# Patient Record
Sex: Female | Born: 1935 | Race: White | Hispanic: No | Smoking: Former smoker
Health system: Southern US, Community
[De-identification: ages and names within clinical notes are randomized; demographics above are authoritative.]

## PROBLEM LIST (undated history)

## (undated) DIAGNOSIS — K5909 Other constipation: Secondary | ICD-10-CM

## (undated) DIAGNOSIS — F32A Depression, unspecified: Secondary | ICD-10-CM

## (undated) DIAGNOSIS — K56609 Unspecified intestinal obstruction, unspecified as to partial versus complete obstruction: Secondary | ICD-10-CM

## (undated) DIAGNOSIS — J449 Chronic obstructive pulmonary disease, unspecified: Secondary | ICD-10-CM

## (undated) DIAGNOSIS — H25019 Cortical age-related cataract, unspecified eye: Secondary | ICD-10-CM

## (undated) DIAGNOSIS — M810 Age-related osteoporosis without current pathological fracture: Secondary | ICD-10-CM

## (undated) DIAGNOSIS — K469 Unspecified abdominal hernia without obstruction or gangrene: Secondary | ICD-10-CM

## (undated) DIAGNOSIS — J45909 Unspecified asthma, uncomplicated: Secondary | ICD-10-CM

## (undated) DIAGNOSIS — F329 Major depressive disorder, single episode, unspecified: Secondary | ICD-10-CM

## (undated) DIAGNOSIS — K859 Acute pancreatitis without necrosis or infection, unspecified: Secondary | ICD-10-CM

## (undated) DIAGNOSIS — K219 Gastro-esophageal reflux disease without esophagitis: Secondary | ICD-10-CM

## (undated) DIAGNOSIS — K8020 Calculus of gallbladder without cholecystitis without obstruction: Secondary | ICD-10-CM

## (undated) DIAGNOSIS — I10 Essential (primary) hypertension: Secondary | ICD-10-CM

## (undated) DIAGNOSIS — M1990 Unspecified osteoarthritis, unspecified site: Secondary | ICD-10-CM

## (undated) DIAGNOSIS — K635 Polyp of colon: Secondary | ICD-10-CM

## (undated) HISTORY — PX: HERNIA REPAIR: SHX51

## (undated) HISTORY — PX: HIATAL HERNIA REPAIR: SHX195

## (undated) HISTORY — PX: COLONOSCOPY: SHX174

## (undated) HISTORY — PX: CHOLECYSTECTOMY: SHX55

## (undated) HISTORY — PX: POLYPECTOMY: SHX149

## (undated) HISTORY — PX: CYST EXCISION: SHX5701

## (undated) HISTORY — PX: CATARACT EXTRACTION, BILATERAL: SHX1313

## (undated) HISTORY — PX: EYE SURGERY: SHX253

## (undated) HISTORY — PX: TONSILLECTOMY: SUR1361

## (undated) HISTORY — PX: STAPEDECTOMY: SHX2435

## (undated) HISTORY — PX: ABDOMINAL HYSTERECTOMY: SHX81

## (undated) HISTORY — PX: SEPTOPLASTY: SUR1290

---

## 2011-06-17 ENCOUNTER — Inpatient Hospital Stay: Payer: Self-pay | Admitting: Radiology

## 2011-06-20 DIAGNOSIS — I369 Nonrheumatic tricuspid valve disorder, unspecified: Secondary | ICD-10-CM

## 2011-06-28 DIAGNOSIS — K219 Gastro-esophageal reflux disease without esophagitis: Secondary | ICD-10-CM

## 2011-06-28 DIAGNOSIS — K469 Unspecified abdominal hernia without obstruction or gangrene: Secondary | ICD-10-CM

## 2011-06-28 DIAGNOSIS — I4891 Unspecified atrial fibrillation: Secondary | ICD-10-CM

## 2011-06-28 DIAGNOSIS — R609 Edema, unspecified: Secondary | ICD-10-CM

## 2011-07-12 DIAGNOSIS — K436 Other and unspecified ventral hernia with obstruction, without gangrene: Secondary | ICD-10-CM

## 2011-07-12 DIAGNOSIS — I4891 Unspecified atrial fibrillation: Secondary | ICD-10-CM

## 2011-07-12 DIAGNOSIS — R609 Edema, unspecified: Secondary | ICD-10-CM

## 2011-07-12 DIAGNOSIS — K219 Gastro-esophageal reflux disease without esophagitis: Secondary | ICD-10-CM

## 2011-09-05 ENCOUNTER — Ambulatory Visit: Payer: Medicare Other | Admitting: Internal Medicine

## 2011-09-11 DIAGNOSIS — K859 Acute pancreatitis without necrosis or infection, unspecified: Secondary | ICD-10-CM

## 2011-09-11 HISTORY — DX: Acute pancreatitis without necrosis or infection, unspecified: K85.90

## 2012-02-10 ENCOUNTER — Inpatient Hospital Stay: Payer: Self-pay | Admitting: Internal Medicine

## 2012-02-10 LAB — LIPASE, BLOOD: Lipase: 2356 U/L — ABNORMAL HIGH (ref 73–393)

## 2012-02-10 LAB — COMPREHENSIVE METABOLIC PANEL
Albumin: 3.4 g/dL (ref 3.4–5.0)
Alkaline Phosphatase: 136 U/L (ref 50–136)
Anion Gap: 9 (ref 7–16)
Calcium, Total: 8.8 mg/dL (ref 8.5–10.1)
Chloride: 105 mmol/L (ref 98–107)
Co2: 29 mmol/L (ref 21–32)
Creatinine: 0.96 mg/dL (ref 0.60–1.30)
EGFR (African American): >60
Glucose: 123 mg/dL — ABNORMAL HIGH (ref 65–99)
Potassium: 3.9 mmol/L (ref 3.5–5.1)
SGPT (ALT): 22 U/L
Sodium: 143 mmol/L (ref 136–145)
Total Protein: 7.6 g/dL (ref 6.4–8.2)

## 2012-02-10 LAB — URINALYSIS, COMPLETE
Blood: NEGATIVE
Glucose,UR: NEGATIVE mg/dL (ref 0–75)
Hyaline Cast: 1
Leukocyte Esterase: NEGATIVE
Ph: 5 (ref 4.5–8.0)
Protein: NEGATIVE
RBC,UR: <1 /HPF (ref 0–5)
Specific Gravity: 1.009 (ref 1.003–1.030)
Squamous Epithelial: 2
WBC UR: <1 /HPF (ref 0–5)

## 2012-02-10 LAB — CBC
HCT: 48.6 % — ABNORMAL HIGH (ref 35.0–47.0)
HGB: 15.8 g/dL (ref 12.0–16.0)
MCH: 30.2 pg (ref 26.0–34.0)
MCHC: 32.5 g/dL (ref 32.0–36.0)
MCV: 93 fL (ref 80–100)
RBC: 5.23 10*6/uL — ABNORMAL HIGH (ref 3.80–5.20)
RDW: 13 % (ref 11.5–14.5)
WBC: 11.3 10*3/uL — ABNORMAL HIGH (ref 3.6–11.0)

## 2012-02-10 LAB — TROPONIN I: Troponin-I: <0.02 ng/mL

## 2012-02-11 LAB — COMPREHENSIVE METABOLIC PANEL
Albumin: 2.8 g/dL — ABNORMAL LOW (ref 3.4–5.0)
Anion Gap: 6 — ABNORMAL LOW (ref 7–16)
Calcium, Total: 7.6 mg/dL — ABNORMAL LOW (ref 8.5–10.1)
Creatinine: 0.77 mg/dL (ref 0.60–1.30)
EGFR (African American): >60
EGFR (Non-African Amer.): >60
Osmolality: 277 (ref 275–301)
Potassium: 4.1 mmol/L (ref 3.5–5.1)
SGOT(AST): 98 U/L — ABNORMAL HIGH (ref 15–37)
Sodium: 140 mmol/L (ref 136–145)
Total Protein: 6.4 g/dL (ref 6.4–8.2)

## 2012-02-11 LAB — LIPID PANEL
HDL Cholesterol: 60 mg/dL (ref 40–60)
Triglycerides: 79 mg/dL (ref 0–200)
VLDL Cholesterol, Calc: 16 mg/dL (ref 5–40)

## 2012-02-11 LAB — CBC WITH DIFFERENTIAL/PLATELET
Basophil #: 0.1 10*3/uL (ref 0.0–0.1)
Basophil %: 0.8 %
Eosinophil #: 0.3 10*3/uL (ref 0.0–0.7)
HCT: 42.1 % (ref 35.0–47.0)
MCHC: 32.1 g/dL (ref 32.0–36.0)
MCV: 93 fL (ref 80–100)
Monocyte %: 9.1 %
Neutrophil #: 6.5 10*3/uL (ref 1.4–6.5)
Neutrophil %: 64.3 %
Platelet: 224 10*3/uL (ref 150–440)
RBC: 4.51 10*6/uL (ref 3.80–5.20)
RDW: 13 % (ref 11.5–14.5)

## 2012-02-11 LAB — HEMOGLOBIN A1C: Hemoglobin A1C: 5.6 % (ref 4.2–6.3)

## 2012-02-11 LAB — IRON AND TIBC
Iron Bind.Cap.(Total): 259 ug/dL (ref 250–450)
Iron Saturation: 32 %
Iron: 84 ug/dL (ref 50–170)
Unbound Iron-Bind.Cap.: 175 ug/dL

## 2012-02-11 LAB — LIPASE, BLOOD: Lipase: 170 U/L (ref 73–393)

## 2012-02-11 LAB — RETICULOCYTES: Reticulocyte: 1.1 % (ref 0.5–1.5)

## 2012-02-11 LAB — TSH: Thyroid Stimulating Horm: 0.984 u[IU]/mL

## 2012-02-12 LAB — BASIC METABOLIC PANEL
Anion Gap: 10 (ref 7–16)
BUN: 7 mg/dL (ref 7–18)
Calcium, Total: 8.6 mg/dL (ref 8.5–10.1)
Chloride: 106 mmol/L (ref 98–107)
Creatinine: 0.79 mg/dL (ref 0.60–1.30)
EGFR (African American): >60
Glucose: 94 mg/dL (ref 65–99)
Potassium: 3.7 mmol/L (ref 3.5–5.1)
Sodium: 143 mmol/L (ref 136–145)

## 2012-02-12 LAB — CBC WITH DIFFERENTIAL/PLATELET
Basophil #: 0.1 10*3/uL (ref 0.0–0.1)
Basophil %: 0.8 %
Eosinophil #: 0.5 10*3/uL (ref 0.0–0.7)
HGB: 14.1 g/dL (ref 12.0–16.0)
MCHC: 32.6 g/dL (ref 32.0–36.0)
MCV: 94 fL (ref 80–100)
Monocyte #: 0.7 x10 3/mm (ref 0.2–0.9)
Neutrophil #: 5.3 10*3/uL (ref 1.4–6.5)
Neutrophil %: 60.2 %
Platelet: 228 10*3/uL (ref 150–440)
RDW: 13.4 % (ref 11.5–14.5)

## 2012-02-12 LAB — HEPATIC FUNCTION PANEL A (ARMC)
Albumin: 3.1 g/dL — ABNORMAL LOW (ref 3.4–5.0)
Alkaline Phosphatase: 153 U/L — ABNORMAL HIGH (ref 50–136)
Bilirubin, Direct: <0.1 mg/dL (ref 0.00–0.20)
Bilirubin,Total: 0.5 mg/dL (ref 0.2–1.0)
Total Protein: 7 g/dL (ref 6.4–8.2)

## 2012-03-28 ENCOUNTER — Inpatient Hospital Stay: Payer: Self-pay | Admitting: Surgery

## 2012-03-28 LAB — COMPREHENSIVE METABOLIC PANEL
Albumin: 3.5 g/dL (ref 3.4–5.0)
Anion Gap: 5 — ABNORMAL LOW (ref 7–16)
BUN: 11 mg/dL (ref 7–18)
Calcium, Total: 9.1 mg/dL (ref 8.5–10.1)
EGFR (African American): 58 — ABNORMAL LOW
EGFR (Non-African Amer.): 50 — ABNORMAL LOW
Glucose: 110 mg/dL — ABNORMAL HIGH (ref 65–99)
Osmolality: 279 (ref 275–301)
Potassium: 4.3 mmol/L (ref 3.5–5.1)
SGOT(AST): 37 U/L (ref 15–37)
Sodium: 140 mmol/L (ref 136–145)
Total Protein: 7.9 g/dL (ref 6.4–8.2)

## 2012-03-28 LAB — URINALYSIS, COMPLETE
Bilirubin,UR: NEGATIVE
Nitrite: NEGATIVE
Ph: 5 (ref 4.5–8.0)
Protein: NEGATIVE
RBC,UR: 1 /HPF (ref 0–5)

## 2012-03-28 LAB — CBC
HGB: 15.7 g/dL (ref 12.0–16.0)
MCH: 29.6 pg (ref 26.0–34.0)
MCHC: 31.8 g/dL — ABNORMAL LOW (ref 32.0–36.0)
MCV: 93 fL (ref 80–100)
Platelet: 269 10*3/uL (ref 150–440)
RDW: 12.9 % (ref 11.5–14.5)
WBC: 11.8 10*3/uL — ABNORMAL HIGH (ref 3.6–11.0)

## 2012-03-28 LAB — LIPASE, BLOOD: Lipase: 129 U/L (ref 73–393)

## 2012-03-29 LAB — CBC WITH DIFFERENTIAL/PLATELET
Basophil %: 0.7 %
Eosinophil #: 0.4 10*3/uL (ref 0.0–0.7)
Eosinophil %: 6.1 %
HGB: 13.4 g/dL (ref 12.0–16.0)
Lymphocyte #: 2.2 10*3/uL (ref 1.0–3.6)
MCH: 30.1 pg (ref 26.0–34.0)
MCV: 94 fL (ref 80–100)
Monocyte #: 0.5 x10 3/mm (ref 0.2–0.9)
Monocyte %: 7.8 %
Neutrophil #: 3.5 10*3/uL (ref 1.4–6.5)
Neutrophil %: 51.9 %
Platelet: 226 10*3/uL (ref 150–440)
RBC: 4.46 10*6/uL (ref 3.80–5.20)

## 2012-03-30 LAB — BASIC METABOLIC PANEL
BUN: 4 mg/dL — ABNORMAL LOW (ref 7–18)
Chloride: 106 mmol/L (ref 98–107)
Co2: 27 mmol/L (ref 21–32)
EGFR (Non-African Amer.): >60
Glucose: 99 mg/dL (ref 65–99)
Osmolality: 280 (ref 275–301)
Potassium: 4.1 mmol/L (ref 3.5–5.1)
Sodium: 142 mmol/L (ref 136–145)

## 2012-04-29 ENCOUNTER — Observation Stay: Payer: Self-pay | Admitting: Surgery

## 2012-04-29 LAB — CBC
HGB: 15.4 g/dL (ref 12.0–16.0)
MCH: 31 pg (ref 26.0–34.0)
Platelet: 273 10*3/uL (ref 150–440)
RBC: 4.99 10*6/uL (ref 3.80–5.20)
WBC: 6.9 10*3/uL (ref 3.6–11.0)

## 2012-04-29 LAB — COMPREHENSIVE METABOLIC PANEL
Albumin: 3.2 g/dL — ABNORMAL LOW (ref 3.4–5.0)
Alkaline Phosphatase: 142 U/L — ABNORMAL HIGH (ref 50–136)
Anion Gap: 5 — ABNORMAL LOW (ref 7–16)
Calcium, Total: 8.8 mg/dL (ref 8.5–10.1)
EGFR (African American): >60
Glucose: 92 mg/dL (ref 65–99)
Osmolality: 283 (ref 275–301)
SGOT(AST): 32 U/L (ref 15–37)
SGPT (ALT): 19 U/L (ref 12–78)
Sodium: 142 mmol/L (ref 136–145)
Total Protein: 7.4 g/dL (ref 6.4–8.2)

## 2012-04-29 LAB — URINALYSIS, COMPLETE
Bilirubin,UR: NEGATIVE
Blood: NEGATIVE
Ph: 5 (ref 4.5–8.0)
RBC,UR: <1 /HPF (ref 0–5)
Squamous Epithelial: <1
WBC UR: 1 /HPF (ref 0–5)

## 2012-04-29 LAB — DIFFERENTIAL
Basophil #: 0.1 10*3/uL (ref 0.0–0.1)
Eosinophil #: 0.3 10*3/uL (ref 0.0–0.7)
Lymphocyte #: 2.3 10*3/uL (ref 1.0–3.6)
Monocyte %: 7.4 %
Neutrophil #: 3.7 10*3/uL (ref 1.4–6.5)
Neutrophil %: 54 %

## 2012-04-30 LAB — BASIC METABOLIC PANEL
Anion Gap: 7 (ref 7–16)
BUN: 13 mg/dL (ref 7–18)
Calcium, Total: 8.6 mg/dL (ref 8.5–10.1)
Co2: 29 mmol/L (ref 21–32)
Creatinine: 0.87 mg/dL (ref 0.60–1.30)
EGFR (African American): >60
EGFR (Non-African Amer.): 58 — ABNORMAL LOW
Glucose: 103 mg/dL — ABNORMAL HIGH (ref 65–99)
Osmolality: 280 (ref 275–301)
Sodium: 140 mmol/L (ref 136–145)

## 2012-04-30 LAB — CBC WITH DIFFERENTIAL/PLATELET
Basophil #: 0 10*3/uL (ref 0.0–0.1)
Eosinophil #: 0.5 10*3/uL (ref 0.0–0.7)
HCT: 44.3 % (ref 35.0–47.0)
HGB: 14.6 g/dL (ref 12.0–16.0)
Lymphocyte #: 2.5 10*3/uL (ref 1.0–3.6)
MCHC: 33 g/dL (ref 32.0–36.0)
MCV: 93 fL (ref 80–100)
Monocyte #: 0.8 x10 3/mm (ref 0.2–0.9)
Monocyte %: 9.6 %
Neutrophil #: 4.5 10*3/uL (ref 1.4–6.5)
Neutrophil %: 54.2 %
Platelet: 270 10*3/uL (ref 150–440)
RDW: 13.2 % (ref 11.5–14.5)
WBC: 8.4 10*3/uL (ref 3.6–11.0)

## 2012-04-30 LAB — LIPASE, BLOOD: Lipase: 122 U/L (ref 73–393)

## 2012-05-01 HISTORY — PX: ESOPHAGOGASTRODUODENOSCOPY: SHX1529

## 2012-05-19 ENCOUNTER — Ambulatory Visit: Payer: Self-pay | Admitting: Surgery

## 2012-05-19 LAB — CBC WITH DIFFERENTIAL/PLATELET
Basophil #: 0.1 10*3/uL (ref 0.0–0.1)
Eosinophil #: 0.5 10*3/uL (ref 0.0–0.7)
Eosinophil %: 5.7 %
HCT: 43.3 % (ref 35.0–47.0)
HGB: 14.3 g/dL (ref 12.0–16.0)
Lymphocyte #: 2.7 10*3/uL (ref 1.0–3.6)
Lymphocyte %: 32 %
MCV: 93 fL (ref 80–100)
Monocyte #: 0.6 x10 3/mm (ref 0.2–0.9)
Monocyte %: 7.7 %
Neutrophil #: 4.5 10*3/uL (ref 1.4–6.5)
RBC: 4.64 10*6/uL (ref 3.80–5.20)
RDW: 13.4 % (ref 11.5–14.5)
WBC: 8.4 10*3/uL (ref 3.6–11.0)

## 2012-05-19 LAB — BASIC METABOLIC PANEL
Anion Gap: 2 — ABNORMAL LOW (ref 7–16)
Calcium, Total: 9.1 mg/dL (ref 8.5–10.1)
Creatinine: 0.77 mg/dL (ref 0.60–1.30)
EGFR (African American): >60
EGFR (Non-African Amer.): >60
Glucose: 77 mg/dL (ref 65–99)
Osmolality: 281 (ref 275–301)
Potassium: 3.7 mmol/L (ref 3.5–5.1)

## 2012-05-26 ENCOUNTER — Inpatient Hospital Stay: Payer: Self-pay | Admitting: Surgery

## 2012-05-27 LAB — BASIC METABOLIC PANEL
Anion Gap: 1 — ABNORMAL LOW (ref 7–16)
BUN: 10 mg/dL (ref 7–18)
Calcium, Total: 8.2 mg/dL — ABNORMAL LOW (ref 8.5–10.1)
Calcium, Total: 8.3 mg/dL — ABNORMAL LOW (ref 8.5–10.1)
Chloride: 107 mmol/L (ref 98–107)
Co2: 28 mmol/L (ref 21–32)
Creatinine: 0.82 mg/dL (ref 0.60–1.30)
EGFR (African American): >60
EGFR (African American): >60
EGFR (Non-African Amer.): 52 — ABNORMAL LOW
Osmolality: 280 (ref 275–301)
Potassium: 4.7 mmol/L (ref 3.5–5.1)
Sodium: 139 mmol/L (ref 136–145)

## 2012-05-27 LAB — CBC WITH DIFFERENTIAL/PLATELET
Basophil #: 0.1 10*3/uL (ref 0.0–0.1)
Basophil %: 0.7 %
Eosinophil #: 0 10*3/uL (ref 0.0–0.7)
Eosinophil %: 0 %
HCT: 41 % (ref 35.0–47.0)
HGB: 13.3 g/dL (ref 12.0–16.0)
Lymphocyte %: 6.6 %
MCH: 30.1 pg (ref 26.0–34.0)
MCHC: 32.4 g/dL (ref 32.0–36.0)
Monocyte #: 1.2 x10 3/mm — ABNORMAL HIGH (ref 0.2–0.9)

## 2012-05-28 LAB — CBC WITH DIFFERENTIAL/PLATELET
Basophil #: 0.1 10*3/uL (ref 0.0–0.1)
Basophil %: 0.6 %
Eosinophil #: 0 10*3/uL (ref 0.0–0.7)
HCT: 40.2 % (ref 35.0–47.0)
Lymphocyte #: 1.4 10*3/uL (ref 1.0–3.6)
Lymphocyte %: 12 %
MCHC: 33 g/dL (ref 32.0–36.0)
Neutrophil #: 9.2 10*3/uL — ABNORMAL HIGH (ref 1.4–6.5)
Platelet: 190 10*3/uL (ref 150–440)
RBC: 4.28 10*6/uL (ref 3.80–5.20)
RDW: 13.6 % (ref 11.5–14.5)
WBC: 11.8 10*3/uL — ABNORMAL HIGH (ref 3.6–11.0)

## 2012-05-28 LAB — BASIC METABOLIC PANEL
Anion Gap: 5 — ABNORMAL LOW (ref 7–16)
BUN: 8 mg/dL (ref 7–18)
Calcium, Total: 7.8 mg/dL — ABNORMAL LOW (ref 8.5–10.1)
Chloride: 108 mmol/L — ABNORMAL HIGH (ref 98–107)
Creatinine: 0.77 mg/dL (ref 0.60–1.30)
Glucose: 123 mg/dL — ABNORMAL HIGH (ref 65–99)
Osmolality: 279 (ref 275–301)
Potassium: 4.3 mmol/L (ref 3.5–5.1)
Sodium: 140 mmol/L (ref 136–145)

## 2012-05-29 LAB — CBC WITH DIFFERENTIAL/PLATELET
Basophil %: 0.7 %
Eosinophil #: 0.2 10*3/uL (ref 0.0–0.7)
Eosinophil %: 1.6 %
HCT: 43.6 % (ref 35.0–47.0)
HGB: 14.2 g/dL (ref 12.0–16.0)
Lymphocyte #: 2.4 10*3/uL (ref 1.0–3.6)
Lymphocyte %: 19.3 %
MCH: 30.8 pg (ref 26.0–34.0)
MCHC: 32.6 g/dL (ref 32.0–36.0)
Monocyte #: 1 x10 3/mm — ABNORMAL HIGH (ref 0.2–0.9)
Monocyte %: 8.2 %
Neutrophil %: 70.2 %
Platelet: 238 10*3/uL (ref 150–440)
RBC: 4.6 10*6/uL (ref 3.80–5.20)
WBC: 12.5 10*3/uL — ABNORMAL HIGH (ref 3.6–11.0)

## 2012-06-01 LAB — CREATININE, SERUM
Creatinine: 0.64 mg/dL (ref 0.60–1.30)
EGFR (African American): >60
EGFR (Non-African Amer.): >60

## 2012-06-02 LAB — BASIC METABOLIC PANEL
Anion Gap: 7 (ref 7–16)
Chloride: 100 mmol/L (ref 98–107)
Co2: 36 mmol/L — ABNORMAL HIGH (ref 21–32)
Osmolality: 282 (ref 275–301)
Potassium: 2.8 mmol/L — ABNORMAL LOW (ref 3.5–5.1)

## 2012-06-04 LAB — BASIC METABOLIC PANEL
BUN: 10 mg/dL (ref 7–18)
Calcium, Total: 8.6 mg/dL (ref 8.5–10.1)
Chloride: 100 mmol/L (ref 98–107)
Co2: 37 mmol/L — ABNORMAL HIGH (ref 21–32)
Creatinine: 0.72 mg/dL (ref 0.60–1.30)
Glucose: 95 mg/dL (ref 65–99)
Osmolality: 280 (ref 275–301)
Potassium: 4 mmol/L (ref 3.5–5.1)
Sodium: 141 mmol/L (ref 136–145)

## 2013-10-01 LAB — COMPREHENSIVE METABOLIC PANEL
Albumin: 3.2 g/dL — ABNORMAL LOW (ref 3.4–5.0)
Alkaline Phosphatase: 98 U/L
Anion Gap: 2 — ABNORMAL LOW (ref 7–16)
BUN: 13 mg/dL (ref 7–18)
Bilirubin,Total: 0.5 mg/dL (ref 0.2–1.0)
Calcium, Total: 8.4 mg/dL — ABNORMAL LOW (ref 8.5–10.1)
Chloride: 104 mmol/L (ref 98–107)
Co2: 30 mmol/L (ref 21–32)
Creatinine: 0.92 mg/dL (ref 0.60–1.30)
EGFR (African American): >60
EGFR (Non-African Amer.): 54 — ABNORMAL LOW
GLUCOSE: 103 mg/dL — AB (ref 65–99)
OSMOLALITY: 272 (ref 275–301)
Potassium: 4.2 mmol/L (ref 3.5–5.1)
SGOT(AST): 46 U/L — ABNORMAL HIGH (ref 15–37)
SGPT (ALT): 26 U/L (ref 12–78)
Sodium: 136 mmol/L (ref 136–145)
Total Protein: 7 g/dL (ref 6.4–8.2)

## 2013-10-01 LAB — CK TOTAL AND CKMB (NOT AT ARMC)
CK, TOTAL: 49 U/L (ref 21–215)
CK-MB: 0.8 ng/mL (ref 0.5–3.6)

## 2013-10-01 LAB — CK-MB
CK-MB: 0.9 ng/mL (ref 0.5–3.6)
CK-MB: 1.1 ng/mL (ref 0.5–3.6)

## 2013-10-01 LAB — TROPONIN I
Troponin-I: <0.02 ng/mL
Troponin-I: <0.02 ng/mL

## 2013-10-01 LAB — CBC
HCT: 42.7 % (ref 35.0–47.0)
HGB: 14 g/dL (ref 12.0–16.0)
MCH: 30.5 pg (ref 26.0–34.0)
MCHC: 32.8 g/dL (ref 32.0–36.0)
MCV: 93 fL (ref 80–100)
Platelet: 187 10*3/uL (ref 150–440)
RBC: 4.59 10*6/uL (ref 3.80–5.20)
RDW: 13.3 % (ref 11.5–14.5)
WBC: 6.8 10*3/uL (ref 3.6–11.0)

## 2013-10-01 LAB — PRO B NATRIURETIC PEPTIDE: B-Type Natriuretic Peptide: 206 pg/mL (ref 0–450)

## 2013-10-01 LAB — RAPID INFLUENZA A&B ANTIGENS

## 2013-10-02 LAB — CBC WITH DIFFERENTIAL/PLATELET
BASOS PCT: 0.2 %
Basophil #: 0 10*3/uL (ref 0.0–0.1)
Eosinophil #: 0 10*3/uL (ref 0.0–0.7)
Eosinophil %: 0 %
HCT: 41.9 % (ref 35.0–47.0)
HGB: 13.6 g/dL (ref 12.0–16.0)
Lymphocyte #: 0.6 10*3/uL — ABNORMAL LOW (ref 1.0–3.6)
Lymphocyte %: 10.3 %
MCH: 30.5 pg (ref 26.0–34.0)
MCHC: 32.6 g/dL (ref 32.0–36.0)
MCV: 94 fL (ref 80–100)
MONO ABS: 0.7 x10 3/mm (ref 0.2–0.9)
Monocyte %: 12 %
NEUTROS ABS: 4.7 10*3/uL (ref 1.4–6.5)
NEUTROS PCT: 77.5 %
Platelet: 198 10*3/uL (ref 150–440)
RBC: 4.48 10*6/uL (ref 3.80–5.20)
RDW: 13 % (ref 11.5–14.5)
WBC: 6 10*3/uL (ref 3.6–11.0)

## 2013-10-02 LAB — COMPREHENSIVE METABOLIC PANEL
ALT: 25 U/L (ref 12–78)
AST: 34 U/L (ref 15–37)
Albumin: 2.9 g/dL — ABNORMAL LOW (ref 3.4–5.0)
Alkaline Phosphatase: 90 U/L
Anion Gap: 2 — ABNORMAL LOW (ref 7–16)
BILIRUBIN TOTAL: 0.3 mg/dL (ref 0.2–1.0)
BUN: 17 mg/dL (ref 7–18)
CALCIUM: 8.1 mg/dL — AB (ref 8.5–10.1)
CHLORIDE: 105 mmol/L (ref 98–107)
Co2: 30 mmol/L (ref 21–32)
Creatinine: 0.85 mg/dL (ref 0.60–1.30)
EGFR (African American): >60
EGFR (Non-African Amer.): 59 — ABNORMAL LOW
GLUCOSE: 118 mg/dL — AB (ref 65–99)
OSMOLALITY: 276 (ref 275–301)
POTASSIUM: 4.4 mmol/L (ref 3.5–5.1)
SODIUM: 137 mmol/L (ref 136–145)
Total Protein: 6.7 g/dL (ref 6.4–8.2)

## 2013-10-03 ENCOUNTER — Inpatient Hospital Stay: Payer: Self-pay | Admitting: Internal Medicine

## 2013-10-03 LAB — CBC WITH DIFFERENTIAL/PLATELET
BASOS PCT: 0.5 %
Basophil #: 0 10*3/uL (ref 0.0–0.1)
EOS PCT: 0 %
Eosinophil #: 0 10*3/uL (ref 0.0–0.7)
HCT: 42.2 % (ref 35.0–47.0)
HGB: 13.9 g/dL (ref 12.0–16.0)
Lymphocyte #: 0.7 10*3/uL — ABNORMAL LOW (ref 1.0–3.6)
Lymphocyte %: 11.7 %
MCH: 31 pg (ref 26.0–34.0)
MCHC: 33 g/dL (ref 32.0–36.0)
MCV: 94 fL (ref 80–100)
MONOS PCT: 5.4 %
Monocyte #: 0.3 x10 3/mm (ref 0.2–0.9)
Neutrophil #: 5 10*3/uL (ref 1.4–6.5)
Neutrophil %: 82.4 %
PLATELETS: 188 10*3/uL (ref 150–440)
RBC: 4.5 10*6/uL (ref 3.80–5.20)
RDW: 12.9 % (ref 11.5–14.5)
WBC: 6.1 10*3/uL (ref 3.6–11.0)

## 2013-10-03 LAB — BASIC METABOLIC PANEL
ANION GAP: 5 — AB (ref 7–16)
BUN: 21 mg/dL — ABNORMAL HIGH (ref 7–18)
CALCIUM: 8.2 mg/dL — AB (ref 8.5–10.1)
CHLORIDE: 106 mmol/L (ref 98–107)
Co2: 30 mmol/L (ref 21–32)
Creatinine: 0.88 mg/dL (ref 0.60–1.30)
EGFR (Non-African Amer.): 57 — ABNORMAL LOW
Glucose: 137 mg/dL — ABNORMAL HIGH (ref 65–99)
Osmolality: 286 (ref 275–301)
POTASSIUM: 4.1 mmol/L (ref 3.5–5.1)
Sodium: 141 mmol/L (ref 136–145)

## 2013-10-04 LAB — CBC WITH DIFFERENTIAL/PLATELET
Basophil #: 0 10*3/uL (ref 0.0–0.1)
Basophil %: 0.5 %
EOS ABS: 0 10*3/uL (ref 0.0–0.7)
EOS PCT: 0 %
HCT: 42.8 % (ref 35.0–47.0)
HGB: 13.9 g/dL (ref 12.0–16.0)
Lymphocyte #: 0.9 10*3/uL — ABNORMAL LOW (ref 1.0–3.6)
Lymphocyte %: 11.7 %
MCH: 30.6 pg (ref 26.0–34.0)
MCHC: 32.5 g/dL (ref 32.0–36.0)
MCV: 94 fL (ref 80–100)
MONOS PCT: 4.3 %
Monocyte #: 0.3 x10 3/mm (ref 0.2–0.9)
NEUTROS PCT: 83.5 %
Neutrophil #: 6.4 10*3/uL (ref 1.4–6.5)
Platelet: 191 10*3/uL (ref 150–440)
RBC: 4.56 10*6/uL (ref 3.80–5.20)
RDW: 13.1 % (ref 11.5–14.5)
WBC: 7.7 10*3/uL (ref 3.6–11.0)

## 2013-10-04 LAB — BASIC METABOLIC PANEL
ANION GAP: 6 — AB (ref 7–16)
BUN: 25 mg/dL — ABNORMAL HIGH (ref 7–18)
CO2: 26 mmol/L (ref 21–32)
Calcium, Total: 8.4 mg/dL — ABNORMAL LOW (ref 8.5–10.1)
Chloride: 105 mmol/L (ref 98–107)
Creatinine: 0.75 mg/dL (ref 0.60–1.30)
EGFR (African American): >60
Glucose: 126 mg/dL — ABNORMAL HIGH (ref 65–99)
Osmolality: 280 (ref 275–301)
Potassium: 4.1 mmol/L (ref 3.5–5.1)
Sodium: 137 mmol/L (ref 136–145)

## 2013-10-05 LAB — CBC WITH DIFFERENTIAL/PLATELET
BASOS ABS: 0 10*3/uL (ref 0.0–0.1)
BASOS PCT: 0.5 %
EOS PCT: 0 %
Eosinophil #: 0 10*3/uL (ref 0.0–0.7)
HCT: 44.4 % (ref 35.0–47.0)
HGB: 14.1 g/dL (ref 12.0–16.0)
Lymphocyte #: 1 10*3/uL (ref 1.0–3.6)
Lymphocyte %: 11.6 %
MCH: 30 pg (ref 26.0–34.0)
MCHC: 31.9 g/dL — ABNORMAL LOW (ref 32.0–36.0)
MCV: 94 fL (ref 80–100)
MONOS PCT: 4.7 %
Monocyte #: 0.4 x10 3/mm (ref 0.2–0.9)
NEUTROS ABS: 7.1 10*3/uL — AB (ref 1.4–6.5)
Neutrophil %: 83.2 %
Platelet: 208 10*3/uL (ref 150–440)
RBC: 4.71 10*6/uL (ref 3.80–5.20)
RDW: 13.2 % (ref 11.5–14.5)
WBC: 8.5 10*3/uL (ref 3.6–11.0)

## 2013-10-05 LAB — BASIC METABOLIC PANEL
ANION GAP: 2 — AB (ref 7–16)
BUN: 25 mg/dL — ABNORMAL HIGH (ref 7–18)
CALCIUM: 8.8 mg/dL (ref 8.5–10.1)
CREATININE: 0.74 mg/dL (ref 0.60–1.30)
Chloride: 104 mmol/L (ref 98–107)
Co2: 30 mmol/L (ref 21–32)
EGFR (Non-African Amer.): >60
Glucose: 123 mg/dL — ABNORMAL HIGH (ref 65–99)
Osmolality: 278 (ref 275–301)
Potassium: 4.2 mmol/L (ref 3.5–5.1)
Sodium: 136 mmol/L (ref 136–145)

## 2014-04-19 ENCOUNTER — Ambulatory Visit: Payer: Self-pay | Admitting: Physician Assistant

## 2014-04-19 LAB — CBC WITH DIFFERENTIAL/PLATELET
BASOS ABS: 0.1 10*3/uL (ref 0.0–0.1)
BASOS PCT: 1 %
Eosinophil #: 0.4 10*3/uL (ref 0.0–0.7)
Eosinophil %: 4.5 %
HCT: 48.9 % — ABNORMAL HIGH (ref 35.0–47.0)
HGB: 15.4 g/dL (ref 12.0–16.0)
Lymphocyte #: 2.2 10*3/uL (ref 1.0–3.6)
Lymphocyte %: 23.9 %
MCH: 30.3 pg (ref 26.0–34.0)
MCHC: 31.5 g/dL — AB (ref 32.0–36.0)
MCV: 96 fL (ref 80–100)
Monocyte #: 0.7 x10 3/mm (ref 0.2–0.9)
Monocyte %: 8 %
Neutrophil #: 5.8 10*3/uL (ref 1.4–6.5)
Neutrophil %: 62.6 %
Platelet: 262 10*3/uL (ref 150–440)
RBC: 5.08 10*6/uL (ref 3.80–5.20)
RDW: 12.9 % (ref 11.5–14.5)
WBC: 9.2 10*3/uL (ref 3.6–11.0)

## 2014-04-19 LAB — PROTIME-INR
INR: 0.9
Prothrombin Time: 12.2 secs (ref 11.5–14.7)

## 2014-04-19 LAB — COMPREHENSIVE METABOLIC PANEL
ALK PHOS: 109 U/L
Albumin: 3.5 g/dL (ref 3.4–5.0)
Anion Gap: 5 — ABNORMAL LOW (ref 7–16)
BUN: 12 mg/dL (ref 7–18)
Bilirubin,Total: 0.4 mg/dL (ref 0.2–1.0)
CALCIUM: 9.4 mg/dL (ref 8.5–10.1)
CHLORIDE: 100 mmol/L (ref 98–107)
CO2: 33 mmol/L — AB (ref 21–32)
CREATININE: 0.99 mg/dL (ref 0.60–1.30)
EGFR (African American): 57 — ABNORMAL LOW
GFR CALC NON AF AMER: 49 — AB
Glucose: 104 mg/dL — ABNORMAL HIGH (ref 65–99)
Osmolality: 276 (ref 275–301)
POTASSIUM: 4 mmol/L (ref 3.5–5.1)
SGOT(AST): 23 U/L (ref 15–37)
SGPT (ALT): 24 U/L
Sodium: 138 mmol/L (ref 136–145)
TOTAL PROTEIN: 7.9 g/dL (ref 6.4–8.2)

## 2014-04-19 LAB — URINALYSIS, COMPLETE
BACTERIA: NONE SEEN
BILIRUBIN, UR: NEGATIVE
BLOOD: NEGATIVE
GLUCOSE, UR: NEGATIVE mg/dL (ref 0–75)
Ketone: NEGATIVE
Leukocyte Esterase: NEGATIVE
NITRITE: NEGATIVE
PROTEIN: NEGATIVE
Ph: 7 (ref 4.5–8.0)
RBC,UR: NONE SEEN /HPF (ref 0–5)
SPECIFIC GRAVITY: 1.027 (ref 1.003–1.030)
WBC UR: NONE SEEN /HPF (ref 0–5)

## 2014-04-19 LAB — APTT: Activated PTT: 36.8 secs — ABNORMAL HIGH (ref 23.6–35.9)

## 2014-04-19 LAB — TROPONIN I

## 2014-04-19 LAB — LIPASE, BLOOD: LIPASE: 192 U/L (ref 73–393)

## 2014-04-20 ENCOUNTER — Inpatient Hospital Stay: Payer: Self-pay | Admitting: Surgery

## 2014-04-20 LAB — COMPREHENSIVE METABOLIC PANEL
ALBUMIN: 3.1 g/dL — AB (ref 3.4–5.0)
ALK PHOS: 86 U/L
ANION GAP: 8 (ref 7–16)
BUN: 13 mg/dL (ref 7–18)
Bilirubin,Total: 0.5 mg/dL (ref 0.2–1.0)
CO2: 31 mmol/L (ref 21–32)
Calcium, Total: 8.9 mg/dL (ref 8.5–10.1)
Chloride: 104 mmol/L (ref 98–107)
Creatinine: 0.95 mg/dL (ref 0.60–1.30)
EGFR (African American): 60 — ABNORMAL LOW
EGFR (Non-African Amer.): 52 — ABNORMAL LOW
Glucose: 93 mg/dL (ref 65–99)
Osmolality: 285 (ref 275–301)
Potassium: 4.4 mmol/L (ref 3.5–5.1)
SGOT(AST): 28 U/L (ref 15–37)
SGPT (ALT): 18 U/L
SODIUM: 143 mmol/L (ref 136–145)
Total Protein: 7 g/dL (ref 6.4–8.2)

## 2014-04-20 LAB — CBC WITH DIFFERENTIAL/PLATELET
Basophil #: 0.1 10*3/uL (ref 0.0–0.1)
Basophil %: 1 %
Eosinophil #: 0.2 10*3/uL (ref 0.0–0.7)
Eosinophil %: 2.3 %
HCT: 45.6 % (ref 35.0–47.0)
HGB: 14.6 g/dL (ref 12.0–16.0)
Lymphocyte #: 1.4 10*3/uL (ref 1.0–3.6)
Lymphocyte %: 17.4 %
MCH: 30.6 pg (ref 26.0–34.0)
MCHC: 31.9 g/dL — ABNORMAL LOW (ref 32.0–36.0)
MCV: 96 fL (ref 80–100)
MONO ABS: 0.6 x10 3/mm (ref 0.2–0.9)
MONOS PCT: 7.7 %
Neutrophil #: 5.6 10*3/uL (ref 1.4–6.5)
Neutrophil %: 71.6 %
Platelet: 230 10*3/uL (ref 150–440)
RBC: 4.76 10*6/uL (ref 3.80–5.20)
RDW: 13.1 % (ref 11.5–14.5)
WBC: 7.9 10*3/uL (ref 3.6–11.0)

## 2014-04-21 LAB — CBC WITH DIFFERENTIAL/PLATELET
BASOS ABS: 0.1 10*3/uL (ref 0.0–0.1)
Basophil %: 0.9 %
Eosinophil #: 0.2 10*3/uL (ref 0.0–0.7)
Eosinophil %: 2.3 %
HCT: 41.1 % (ref 35.0–47.0)
HGB: 13 g/dL (ref 12.0–16.0)
Lymphocyte #: 2.4 10*3/uL (ref 1.0–3.6)
Lymphocyte %: 28 %
MCH: 30.8 pg (ref 26.0–34.0)
MCHC: 31.7 g/dL — ABNORMAL LOW (ref 32.0–36.0)
MCV: 97 fL (ref 80–100)
MONO ABS: 0.7 x10 3/mm (ref 0.2–0.9)
MONOS PCT: 8.1 %
Neutrophil #: 5.2 10*3/uL (ref 1.4–6.5)
Neutrophil %: 60.7 %
Platelet: 211 10*3/uL (ref 150–440)
RBC: 4.24 10*6/uL (ref 3.80–5.20)
RDW: 13.1 % (ref 11.5–14.5)
WBC: 8.6 10*3/uL (ref 3.6–11.0)

## 2014-04-21 LAB — BASIC METABOLIC PANEL
Anion Gap: 9 (ref 7–16)
BUN: 15 mg/dL (ref 7–18)
CALCIUM: 8.4 mg/dL — AB (ref 8.5–10.1)
CHLORIDE: 105 mmol/L (ref 98–107)
CO2: 27 mmol/L (ref 21–32)
CREATININE: 0.91 mg/dL (ref 0.60–1.30)
GFR CALC NON AF AMER: 54 — AB
Glucose: 68 mg/dL (ref 65–99)
OSMOLALITY: 280 (ref 275–301)
POTASSIUM: 4.1 mmol/L (ref 3.5–5.1)
SODIUM: 141 mmol/L (ref 136–145)

## 2014-04-22 LAB — BASIC METABOLIC PANEL
ANION GAP: 8 (ref 7–16)
BUN: 10 mg/dL (ref 7–18)
CHLORIDE: 106 mmol/L (ref 98–107)
CREATININE: 0.73 mg/dL (ref 0.60–1.30)
Calcium, Total: 7.5 mg/dL — ABNORMAL LOW (ref 8.5–10.1)
Co2: 27 mmol/L (ref 21–32)
GLUCOSE: 57 mg/dL — AB (ref 65–99)
Osmolality: 278 (ref 275–301)
Potassium: 4.1 mmol/L (ref 3.5–5.1)
SODIUM: 141 mmol/L (ref 136–145)

## 2014-04-22 LAB — CBC WITH DIFFERENTIAL/PLATELET
Basophil #: 0.1 10*3/uL (ref 0.0–0.1)
Basophil %: 1 %
EOS PCT: 3 %
Eosinophil #: 0.2 10*3/uL (ref 0.0–0.7)
HCT: 40 % (ref 35.0–47.0)
HGB: 12.8 g/dL (ref 12.0–16.0)
LYMPHS ABS: 1.8 10*3/uL (ref 1.0–3.6)
LYMPHS PCT: 30 %
MCH: 30.7 pg (ref 26.0–34.0)
MCHC: 32.1 g/dL (ref 32.0–36.0)
MCV: 96 fL (ref 80–100)
Monocyte #: 0.5 x10 3/mm (ref 0.2–0.9)
Monocyte %: 8.2 %
Neutrophil #: 3.6 10*3/uL (ref 1.4–6.5)
Neutrophil %: 57.8 %
Platelet: 198 10*3/uL (ref 150–440)
RBC: 4.18 10*6/uL (ref 3.80–5.20)
RDW: 12.5 % (ref 11.5–14.5)
WBC: 6.1 10*3/uL (ref 3.6–11.0)

## 2014-04-25 LAB — BASIC METABOLIC PANEL
ANION GAP: 5 — AB (ref 7–16)
BUN: 11 mg/dL (ref 7–18)
CHLORIDE: 105 mmol/L (ref 98–107)
Calcium, Total: 7.6 mg/dL — ABNORMAL LOW (ref 8.5–10.1)
Co2: 30 mmol/L (ref 21–32)
Creatinine: 0.73 mg/dL (ref 0.60–1.30)
Glucose: 107 mg/dL — ABNORMAL HIGH (ref 65–99)
Osmolality: 279 (ref 275–301)
POTASSIUM: 3.9 mmol/L (ref 3.5–5.1)
SODIUM: 140 mmol/L (ref 136–145)

## 2014-04-25 LAB — CBC WITH DIFFERENTIAL/PLATELET
BASOS ABS: 0.1 10*3/uL (ref 0.0–0.1)
Basophil %: 0.6 %
EOS ABS: 0 10*3/uL (ref 0.0–0.7)
Eosinophil %: 0 %
HCT: 39.2 % (ref 35.0–47.0)
HGB: 12.7 g/dL (ref 12.0–16.0)
Lymphocyte #: 1.4 10*3/uL (ref 1.0–3.6)
Lymphocyte %: 12.5 %
MCH: 30.6 pg (ref 26.0–34.0)
MCHC: 32.3 g/dL (ref 32.0–36.0)
MCV: 95 fL (ref 80–100)
MONO ABS: 0.8 x10 3/mm (ref 0.2–0.9)
MONOS PCT: 7.2 %
Neutrophil #: 9.2 10*3/uL — ABNORMAL HIGH (ref 1.4–6.5)
Neutrophil %: 79.7 %
Platelet: 211 10*3/uL (ref 150–440)
RBC: 4.14 10*6/uL (ref 3.80–5.20)
RDW: 12.6 % (ref 11.5–14.5)
WBC: 11.5 10*3/uL — ABNORMAL HIGH (ref 3.6–11.0)

## 2014-04-27 LAB — CBC WITH DIFFERENTIAL/PLATELET
Basophil #: 0.1 10*3/uL (ref 0.0–0.1)
Basophil %: 1.1 %
EOS PCT: 3.1 %
Eosinophil #: 0.3 10*3/uL (ref 0.0–0.7)
HCT: 35.8 % (ref 35.0–47.0)
HGB: 11.9 g/dL — AB (ref 12.0–16.0)
Lymphocyte #: 1.8 10*3/uL (ref 1.0–3.6)
Lymphocyte %: 21.8 %
MCH: 31.8 pg (ref 26.0–34.0)
MCHC: 33.2 g/dL (ref 32.0–36.0)
MCV: 96 fL (ref 80–100)
MONO ABS: 0.8 x10 3/mm (ref 0.2–0.9)
Monocyte %: 9.9 %
NEUTROS ABS: 5.3 10*3/uL (ref 1.4–6.5)
NEUTROS PCT: 64.1 %
PLATELETS: 201 10*3/uL (ref 150–440)
RBC: 3.74 10*6/uL — ABNORMAL LOW (ref 3.80–5.20)
RDW: 13 % (ref 11.5–14.5)
WBC: 8.3 10*3/uL (ref 3.6–11.0)

## 2014-04-27 LAB — BASIC METABOLIC PANEL
ANION GAP: 7 (ref 7–16)
BUN: 8 mg/dL (ref 7–18)
CHLORIDE: 101 mmol/L (ref 98–107)
CREATININE: 0.66 mg/dL (ref 0.60–1.30)
Calcium, Total: 7.5 mg/dL — ABNORMAL LOW (ref 8.5–10.1)
Co2: 31 mmol/L (ref 21–32)
EGFR (African American): >60
EGFR (Non-African Amer.): >60
Glucose: 92 mg/dL (ref 65–99)
Osmolality: 276 (ref 275–301)
POTASSIUM: 3.3 mmol/L — AB (ref 3.5–5.1)
SODIUM: 139 mmol/L (ref 136–145)

## 2014-05-02 ENCOUNTER — Emergency Department: Payer: Self-pay | Admitting: Emergency Medicine

## 2014-05-03 LAB — CBC
HCT: 41.3 % (ref 35.0–47.0)
HGB: 13 g/dL (ref 12.0–16.0)
MCH: 30.5 pg (ref 26.0–34.0)
MCHC: 31.5 g/dL — ABNORMAL LOW (ref 32.0–36.0)
MCV: 97 fL (ref 80–100)
Platelet: 319 10*3/uL (ref 150–440)
RBC: 4.26 10*6/uL (ref 3.80–5.20)
RDW: 13 % (ref 11.5–14.5)
WBC: 8.4 10*3/uL (ref 3.6–11.0)

## 2014-05-14 ENCOUNTER — Ambulatory Visit: Payer: Self-pay | Admitting: Surgery

## 2014-12-28 NOTE — Consult Note (Signed)
No abd pain today. EGD normal. Colonosocopy showed left sided diverticulosis and two small polyps in descending colon and sigmoid colon. Both polyps were removed. High fiber diet ordered. Can proceed with abd hernia repair any time. Thanks.  Electronic Signatures: Lutricia Feilh, Alexandar Weisenberger (MD)  (Signed on 22-Aug-13 15:53)  Authored  Last Updated: 22-Aug-13 15:53 by Lutricia Feilh, Rage Beever (MD)

## 2014-12-28 NOTE — Discharge Summary (Signed)
PATIENT NAME:  Diana Meadows, Diana Meadows MR#:  [id number] DATE OF BIRTH:  1919-07-26  DATE OF ADMISSION:  04/29/2012 DATE OF DISCHARGE:  05/02/2012  BRIEF HISTORY: Diana Meadows is a 81 year old woman seen in the Emergency Room with chronic abdominal pain with recent exacerbation. She underwent a ventral hernia repair performed in the fall of 2012 and had an episode of pancreatitis post surgery. She had recurrence of the hernia during the spring and another episode of pancreatitis. She was originally set up for evaluation of her persistent abdominal discomfort as an outpatient but was upset at the delay and came back to the Emergency Room with increasing complaints of abdominal discomfort. She was admitted that evening for further evaluation.   HOSPITAL COURSE: CT scan without contrast revealed a known ventral hernia without any evidence of obstruction. She had no other GI symptoms at the time. She was seen by Dr. Lutricia FeilPaul Oh on the morning of August 21st who recommend proceeding with upper and lower endoscopy. Both procedures were largely unremarkable. There was no evidence of any significant GI disease other than some mild diverticulosis. Her pain continued to be in the area of her ventral hernia. Laboratory values remained stable. She was discharged home on the 23rd tolerating a regular diet with the plan of being evaluated in the outpatient office for possible surgical intervention of her recurrent hernia. This plan was discussed with the patient in detail and she is in agreement. The plan had also been reviewed with her daughter.   DISCHARGE MEDICATIONS: Acetaminophen p.r.n. She is on no other medications.   FINAL DISCHARGE DIAGNOSES:  1. Abdominal pain. 2. Recurrent ventral hernia.  PROCEDURES PERFORMED: EGD and colonoscopy. ____________________________ Carmie Endalph L. Ely III, MD rle:rbg D:  05/12/2012 20:34:40 ET  T:  05/14/2012 10:39:43 ET  JOB#: [id number]  cc: Marya AmslerMarshall W. Dareen PianoAnderson, MD Ezzard StandingPaul Y. Bluford Kaufmannh, MD Quentin OreALPH L  ELY MD ELECTRONICALLY SIGNED 05/14/2012 20:49

## 2014-12-28 NOTE — H&P (Signed)
PATIENT NAME:  Diana Meadows, Diana Meadows MR#:  [id number] DATE OF BIRTH:  09/11/1918  DATE OF ADMISSION:  03/28/2012  CHIEF COMPLAINT: Abdominal pain.   HISTORY OF PRESENT ILLNESS: This is a patient with a history of recurrent ventral hernias and pancreatitis who presented to the Emergency Room with approximately eight hours of abdominal pain. She cannot localize it to any specific area and states that it is diffuse, but it is much improved now over what it was before. She also had an episode of three emesis in rapid fashion prior to her CAT scan here in the Emergency Room. She states she feels better after the vomiting. She has not vomited since and has had large volume of contrast and has not vomited. She had a bowel movement two days ago, has not passed much flatus if any since then.   She has also had a recent admission for pancreatitis and had a similar episode to this several months ago where she had a ventral hernia repair by Dr. Egbert Garibaldi for small bowel obstruction. She denies fevers or chills or anorexia.   PAST MEDICAL HISTORY: Vitamin D deficiency.   PAST SURGICAL HISTORY:  1. Hysterectomy.  2. Ventral hernia repair.   MEDICATIONS: Over the counter medications, Flexeril and Estrace.   ALLERGIES: Codeine.   SOCIAL HISTORY: The patient drinks alcohol occasionally, has not smoked in many years, and is not employed.   FAMILY HISTORY: Noncontributory.  REVIEW OF SYSTEMS: A 10 system review has been performed and negative with the exception of that mentioned in the history of present illness.    PHYSICAL EXAMINATION:   GENERAL: Comfortable-appearing Caucasian female patient.  VITALS: She is afebrile. Pulse is 76, respirations 16, blood pressure 147/75, and pulse oximetry 92%.   HEENT: No scleral icterus.   NECK: No palpable neck nodes.   CHEST: Clear to auscultation.   CARDIAC: Regular rate and rhythm.   ABDOMEN: Slightly distended. There is a healing midline wound without drainage.  There is a question of a possible palpable hernia in the infraumbilical area. It is nontender. The abdomen is diffusely tender, but there is no rebound, guarding, or percussion tenderness.   EXTREMITIES: Minimal edema.   NEUROLOGIC: Grossly intact.   INTEGUMENT: No jaundice.   RADIOLOGIC DATA: CT scan is personally reviewed. The question of a small bowel obstruction is raised. There is gas in her colon and rectum along with a large amount of stool. Additionally, the contrast in the small bowel goes down into the pelvis. There is an area of somewhat collapsed, noncontrast filled small bowel distal to that. There is also small bowel in the ventral hernia, but that does not seem to be dilated or contracted on either end of the entry exit point.   LABORATORY DATA: White blood cell count is 11.8, hemoglobin and hematocrit of 15.7 and 49, and platelet count 269. Lipase is 129. Electrolytes are within normal limits.   ASSESSMENT AND PLAN: This is a patient with a partial small bowel obstruction and a recurrent ventral hernia. I do not believe that the partial small bowel obstruction is related to the ventral hernia, however, it is certainly a consideration. I do see more evidence of distal obstruction in the pelvis in the form of dilated bowel with contrast entering the pelvis and contracted bowel coming out of the pelvis. The actual transition point cannot be seen at this point. My plan will be to place a nasogastric tube to try to remove most of the contrast from the stomach  and small bowel and reexamine the patient. I she were to worsen at any point, she would require emergency surgery and that would include lysis of adhesions in the pelvis as well as repair of this recurrent ventral hernia, possibly with mesh, but at this point I would like to treat her as a classic adhesive bowel obstruction patient and observe her. We will repeat KUBs and physical examinations. The patient and her daughter were in  agreement with this plan.  ____________________________ Adah Salvageichard E. Excell Seltzerooper, MD rec:slb D: 03/28/2012 12:49:05 ET T: 03/28/2012 13:03:19 ET JOB#: [id number]  cc: Adah Salvageichard E. Excell Seltzerooper, MD, <Dictator> Lattie HawICHARD E Mansour Balboa MD ELECTRONICALLY SIGNED 03/28/2012 18:19

## 2014-12-28 NOTE — H&P (Signed)
PATIENT NAME:  Diana Meadows, Diana Meadows MR#:  [id number] DATE OF BIRTH:  [birth year]  DATE OF ADMISSION:  04/29/2012  CHIEF COMPLAINT: Abdominal pain.   PRIMARY CARE PHYSICIAN: Einar CrowMarshall Anderson, MD   ADMITTING PHYSICIAN: Tiney Rougealph Ely, MD  BRIEF HISTORY: Diana Meadows is a 81 year old woman seen in the Emergency Room with recurrent abdominal pain. She had ventral hernia repair performed by Dr. Natale LayMark Bird in the fall of 2012 and had an episode of pancreatitis post surgery. She was admitted in June with another episode of possible pancreatitis. She was admitted in July to the surgical service with vague recurrent abdominal discomfort. The etiology of the pain was never localized. CT scan was unremarkable other than the presence of hernia. There did not appear to be a specific obstruction. Her symptoms were nonspecific and were not associated with nausea or vomiting. Last night she had some increasing abdominal pain again and was seen in the Emergency Room this morning with persistent abdominal discomfort, again primarily in the right upper quadrant and right lower quadrant areas.   She was originally set up for an outpatient evaluation in both the surgery office and gastroenterology office for consideration of colonoscopy, EGD and possible repeat surgical repair. The patient was actually set up for an appointment on 04/30/2012 but the appointment was changed to accommodate the gastroenterology service and she is now set up to see gastroenterology until early September. With the increase in her pain she became quite frustrated and presented back to the Emergency Room with complaint that "nobody is doing anything". We will go on and admit her this evening for further evaluation of her current abdominal symptoms.   ER work-up revealed no significant abnormalities other than slightly decreased serum albumin. CT scan without contrast reveals a known ventral hernia without any evidence of obstruction. The appendix was upper  limits of normal in size without evidence of any periappendiceal inflammatory changes. The emergency room work-up suggested possible obstruction and possible appendicitis and the surgical service was consulted.   She has no significant medical problems in the past. She is regularly followed by Dr. Einar CrowMarshall Anderson and does not take any current medications, other than over-the-counter medications. She had a previous hysterectomy, cholecystectomy and ventral hernia repair. She does have history of possible vitamin D deficiency.   SOCIAL HISTORY: History is significant for occasional alcohol use, although there is some question of potential abuse. She is not a cigarette smoker and is retired.   REVIEW OF SYSTEMS: A 10 point. review of systems is otherwise unremarkable.   PHYSICAL EXAMINATION:   GENERAL: She is upset and frustrated and a bit combative in the emergency room.   VITALS: Blood pressure is 130/67, heart rate 82 and regular, and she is afebrile.   HEENT: No scleral icterus although she does have some scleral hemorrhage in the right eye. She has no facial deformities.   NECK: Supple without adenopathy. Trachea is midline.   CHEST: Clear without evidence of adventitious sounds and she has normal pulmonary excursion.   CARDIAC: No murmurs or gallops. She seems to be in normal sinus rhythm.   ABDOMEN: Soft and mildly distended with no point tenderness, rebound, guarding, or masses. She does have a lower midline hernia which is tender but easily reducible. There is no evidence of any incarceration.   EXTREMITIES: Lower extremity exam reveals full distal pulses, no deformities, and no significant edema.   PSYCHIATRIC: She is an agitated woman in no significant distress, appears to be normally oriented.  RESULTS: I independently reviewed her CT scan and her laboratory values. I do not see any evidence of obstruction and she does not appear to have any evidence of incarcerated hernia.  The appendix is at the upper limits of normal without any evidence of periappendiceal changes suggesting appendicitis and her clinical examination does not suggest appendicitis.   ASSESSMENT AND PLAN: She is upset that no one appears to be doing anything about her recurrent or persistent problem. We will go on and admit her tonight for pain control, get a gastroenterology consultation from [facility] as she is a patient of theirs and make a decision regarding further surgical intervention of her abdominal hernia. This plan has been discussed with her in detail and she is in agreement.  ____________________________ Carmie End, MD rle:slb D: 04/29/2012 17:31:02 ET T: 04/29/2012 17:55:07 ET JOB#: [id number]  cc: Carmie End, MD, <Dictator> Marya Amsler. Dareen Piano, MD Quentin Ore MD ELECTRONICALLY SIGNED 04/30/2012 18:21

## 2014-12-28 NOTE — Consult Note (Signed)
Pt seen and examined. See C.London's patient. Pt already has trilyte at home. Since trilyte not available here, will have her family bring the prep here so she can start drinking the prep tonight. NPO after 8 AM tomorrow for colonoscopy/EGD tomorrow afternoon. Thanks.  Electronic Signatures: Lutricia Feilh, Deshannon Hinchliffe (MD)  (Signed on 21-Aug-13 16:47)  Authored  Last Updated: 21-Aug-13 16:47 by Lutricia Feilh, Persephonie Hegwood (MD)

## 2014-12-28 NOTE — Discharge Summary (Signed)
PATIENT NAME:  Diana Meadows, Diana Meadows MR#:  [id number] DATE OF BIRTH:  28-Apr-1919  DATE OF ADMISSION:  05/26/2012 DATE OF DISCHARGE:  06/05/2012  FINAL DIAGNOSES:  1. Ventral hernia.  2. Postoperative ileus, resolved.   PRINCIPLE PROCEDURES:  1. Repair of ventral hernia with dual mesh, lysis of adhesions, exploratory laparotomy on September 16th.  2. Plain films of the abdomen. 3.. LLL mass- needs further evaluation.  HOSPITAL COURSE SUMMARY: The patient had a prolonged hospitalization. Postoperative days #1 and #2 the patient had a binder in place. Her potassium was found to be elevated on postoperative day #1 at 5.7. It was repeated and found to be 4.7 after discontinuation of intravenous potassium. On postoperative day #3 the patient was making slow progress. Physical therapy became involved. She then began having emesis on postoperative day #3. Plain films demonstrated a possible left lower quadrant infiltrate for which she was placed on intravenous Levaquin and patterns consistent with an ileus. A nasogastric tube was passed. Postoperative day #4 the patient had a small bowel movement and some flatus. Nasogastric tube was placed to straight drain. Intravenous antibiotics were continued. She had no signs of respiratory compromise. On postoperative day #5 her pain was well controlled and her nasogastric tube was able to be removed. Her diet was able to be advanced to clears. On postoperative day #6 the patient was continuing on 4 liters of nasal cannula. Her IV fluids were discontinued. Follow-up chest x-ray demonstrated minimal findings within the left lowerlung fields. On September 23rd the patient did not want to go home without a bowel movement. She was given some laxatives. On the 24th the patient was much more mobile, had some bowel function, still concerned about constipation. On September 25th the patient continued to do well, however, had some atelectasis and some mild chest pain at the time.  Oxygen sats were found to be in the high 80's. CT scan of the chest to rule out PE was negative. By the following day the patient was improving. Room air saturations were acceptable. CT scan of the chest demonstrated an incidental left lower lung lesion for which she will be followed up as an outpatient with Dr. Thelma Bargeaks.   DISCHARGE MEDICATIONS:  1. Acetaminophen. 2. Dulcolax. 3. Vitamin D3.   4. Tramadol 50 mg by mouth every six hours as needed for pain.   ____________________________ Redge GainerMark A. Egbert GaribaldiBird, MD mab:drc D: 06/18/2012 14:06:59 ET T: 06/18/2012 15:24:08 ET JOB#: [id number]  cc: Loraine LericheMark A. Egbert GaribaldiBird, MD, <Dictator> Taunja Brickner A Rayaan Garguilo MD ELECTRONICALLY SIGNED 06/18/2012 22:27

## 2014-12-28 NOTE — Consult Note (Signed)
PATIENT NAME:  Diana Meadows, Diana Meadows MR#:  [id number] DATE OF BIRTH:  [birth year]  DATE OF CONSULTATION:  03/28/2012  REFERRING PHYSICIAN:  Dionne Milo, MD CONSULTING PHYSICIAN:  Richarda Overlie, MD  REASON FOR CONSULTATION: Medical management.   SUBJECTIVE: This is a 81 year old female who presented earlier today with complaints of abdominal pain. The patient has a history of multiple abdominal surgeries and presented with small bowel obstruction. The patient had a CT scan in the ER today that showed bowel obstruction with a transition point. The patient presented with vomiting and abdominal pain for about two days associated with constipation and not passing any flatus. She however has been medically stable and has not had any cardiopulmonary symptoms. She has been in good health and does not report any medical problems in her past medical history. All of her hospitalizations in the past have been related to surgical emergencies or surgical problems.   PAST MEDICAL HISTORY: Vitamin D deficiency.   PAST SURGICAL HISTORY:  1. Hysterectomy.  2. Ventral hernia repair.  3. Three Cesarean sections.  4. Cholecystectomy.   HOME MEDICATIONS: She takes over-the-counter medications like Flexeril and Estrace.   ALLERGIES: She reports allergy to codeine and NSAIDs.   SOCIAL HISTORY: She drinks alcohol occasionally and she has not smoked in many years. She is retired.  FAMILY HISTORY: Reviewed and found to be negative for hypertension, diabetes, and dyslipidemia.   REVIEW OF SYSTEMS: A complete 14-point review of systems was done. CONSTITUTIONAL: No fever, fatigue, weakness, weight loss, or weight gain. EYES: No blurry vision, double vision, pain, or redness. ENT: No tinnitus, hearing loss, allergies, or epistaxis. RESPIRATORY: No cough, wheezing, hemoptysis, or dyspnea. CARDIOVASCULAR: No chest pain, orthopnea, palpitations, or syncope. GASTROINTESTINAL: No nausea, vomiting, or abdominal pain. GENITOURINARY:  No dysuria, hematuria, or renal calculi. ENDOCRINE: No polyuria, nocturia, thyroid problems, or increased sweating. INTEGUMENTARY: No rash or change in mole, hair, or skin. MUSCULOSKELETAL: No neck, back, shoulder, or knee pain. NEURO: No numbness, weakness, dysarthria, epilepsy, tremor, vertigo, or ataxia. PSYCHIATRIC: No anxiety, insomnia, bipolar disorder, or depression.   PHYSICAL EXAMINATION:   VITAL SIGNS: Blood pressure 147/75, pulse 99, pulse 76, respirations 18, and saturation 92% on room air.   GENERAL: Currently comfortable, in no acute cardiopulmonary distress.   HEENT: Pupils are equal and reactive. Extraocular movements are intact.   NECK: Supple. No JVD.   LUNGS: Clear to auscultation bilaterally. No wheezes, crackles, or rhonchi.   CARDIOVASCULAR: Regular rate and rhythm. No murmurs, rubs, or gallops.   ABDOMEN: Decreased bowel sounds, nontender to palpation, no rebound or guarding, and Murphy's sign is negative.   NEUROLOGIC: Cranial nerves II through XII grossly intact. Deep tendon reflexes 2+ bilaterally. Motor strength intact in bilateral upper and lower extremities.   PSYCHIATRIC: Appropriate mood and affect.   LYMPHATIC: No axillary, inguinal, or cervical lymphadenopathy.   LABORATORY AND RADIOLOGIC DATA:  CT shows small bowel obstruction.   Lipase 139. WBC 11.8, hemoglobin 15.7, hematocrit 49.2, and platelet count 269. BUN 11, creatinine 0.99, sodium 140, potassium 4.3, chloride 104, bicarbonate 31, total bilirubin 0.4, alkaline phosphatase 131, ALT 23, and AST 27. Anion gap 5.  Urinalysis is negative for nitrite and trace amount of leukocyte esterase with 3 WBCs per high-power field.  The patient had a recent echocardiogram in October 2012 that showed an ejection fraction of 55% with mild left ventricular hypertrophy and underlying diastolic dysfunction   ASSESSMENT AND PLAN:  1. Small bowel obstruction.  2. Chronic diastolic heart failure without  exacerbation.   PLAN: The patient has no significant medical problems. She does not report any cardiopulmonary symptoms. Her only medical problem is vitamin D deficiency.   She had a normal 2-D echo last year and has undergone surgery last year without consequence.   She does not have any cardiopulmonary symptoms to suggest that there is an obvious contraindication to her undergoing surgery if indicated.   At this time, we will sign off. If any persistent medical issues arise, then kindly reconsult [facility].  ____________________________ Richarda OverlieNayana Reine Bristow, MD na:slb D: 03/28/2012 23:30:42 ET T: 03/29/2012 11:15:21 ET JOB#: [id number]  cc: Richarda OverlieNayana Shaniqwa Horsman, MD, <Dictator> Richard E. Excell Seltzerooper, MD Richarda OverlieNAYANA Johnwilliam Shepperson MD ELECTRONICALLY SIGNED 03/30/2012 23:43

## 2014-12-28 NOTE — Consult Note (Signed)
Brief Consult Note: Diagnosis: Abdominal Pain.   Patient was seen by consultant.   Consult note dictated.   Comments: Appreciate consult for 81 y/o caucasian woman with history of pancreatitis, abdominal hernias, cholecystectomy, for evaluation of abdominal pain. [facility] and is on schedule for EGD and colonoscopy for evaluation. States she is under consideration for a repeat hernia repair.  Evidentally the only luminal evaluation she has had is a flexible sigmoidoscopy 6458yr ago that had negative findings. States that her abdominal pain travels all the way across her upper abd, and down to the right lower quadrant. It is of an intermittent nature and varies in intensity. Increased yesterday. Controlling her chronic constipation helps make it better: she takes Dulcolax and prune juice daily to do this. Lying down and being still also helps this. Alternatlively, constipation makes it worse. Has been present for the last 3-4 months. Denies chronic NSAID use, acid reflux, heartbur, problems swallowing, NVD, black tarry/bloody stools, changes in bowel habits.  States no history of liver disease. Did have ventral hernia repair last autummn. Abdomen soft, no real tenderness appreciated today. Unable to appreciate mass or HSM. Active bowel sounds x 4.  Impression and plan:   Abdominal pain- Bilat upper quadrant and RLQ. Agree with PPI. Labs with mild ALP elevation, but otherwise unremarkable. No indication of pancreatitis this visit. Will plan for EGD and colonoscopy as clinically feasible.  Electronic Signatures: Vevelyn PatLondon, Kumiko Fishman H (NP)  (Signed 21-Aug-13 15:35)  Authored: Brief Consult Note   Last Updated: 21-Aug-13 15:35 by Keturah BarreLondon, Dilynn Munroe H (NP)

## 2014-12-28 NOTE — H&P (Signed)
Subjective/Chief Complaint abd pain    History of Present Illness pain started last night, is now improved. three emesis last night in ED prior to CT. BM Wed after dulcolax supp. No flatus since. Paun was diffuse, nonlocalized    Past History pancreatitis, VH  hysterectomy   Past Med/Surgical Hx:  Hysterectomy:   Hernia Repair:   ALLERGIES:  Codeine: Unknown  NSAIDS: Unknown  Family and Social History:   Family History Non-Contributory    Social History negative tobacco, positive ETOH    Place of Living Home   Review of Systems:   Fever/Chills No    Cough No    Abdominal Pain Yes    Diarrhea No    Constipation Yes    Nausea/Vomiting Yes    SOB/DOE No    Chest Pain No    Dysuria No    Tolerating Diet No  Nauseated  Vomiting    Medications/Allergies Reviewed Medications/Allergies reviewed   Physical Exam:   GEN no acute distress, comfortable    HEENT pink conjunctivae    NECK supple    RESP normal resp effort  clear BS  no use of accessory muscles    CARD regular rate    ABD denies tenderness  positive hernia  soft  nontender, palp rec VH? infraumb    LYMPH negative neck    EXTR negative edema    SKIN normal to palpation    PSYCH alert, A+O to time, place, person, good insight   Lab Results: Hepatic:  19-Jul-13 00:18    Bilirubin, Total 0.4   Alkaline Phosphatase 131   SGPT (ALT) 23 (12-78 NOTE: NEW REFERENCE RANGE 08/03/2011)   SGOT (AST) 37   Total Protein, Serum 7.9   Albumin, Serum 3.5  Routine Chem:  19-Jul-13 00:18    Glucose, Serum  110   BUN 11   Creatinine (comp) 0.99   Sodium, Serum 140   Potassium, Serum 4.3   Chloride, Serum 104   CO2, Serum 31   Calcium (Total), Serum 9.1   Osmolality (calc) 279   eGFR (African American)  58   eGFR (Non-African American)  50 (eGFR values <2m/min/1.73 m2 may be an indication of chronic kidney disease (CKD). Calculated eGFR is useful in patients with stable renal function. The  eGFR calculation will not be reliable in acutely ill patients when serum creatinine is changing rapidly. It is not useful in  patients on dialysis. The eGFR calculation may not be applicable to patients at the low and high extremes of body sizes, pregnant women, and vegetarians.)   Anion Gap  5   Lipase 129 (Result(s) reported on 28 Mar 2012 at 12:49AM.)  Routine UA:  19-Jul-13 00:18    Color (UA) Yellow   Clarity (UA) Cloudy   Glucose (UA) Negative   Bilirubin (UA) Negative   Ketones (UA) Trace   Specific Gravity (UA) 1.021   Blood (UA) Negative   pH (UA) 5.0   Protein (UA) Negative   Nitrite (UA) Negative   Leukocyte Esterase (UA) Trace (Result(s) reported on 28 Mar 2012 at 01:14AM.)   RBC (UA) 1 /HPF   WBC (UA) 3 /HPF   Bacteria (UA) TRACE   Epithelial Cells (UA) 7 /HPF   Mucous (UA) PRESENT (Result(s) reported on 28 Mar 2012 at 01:14AM.)  Routine Hem:  19-Jul-13 00:18    WBC (CBC)  11.8   RBC (CBC)  5.29   Hemoglobin (CBC) 15.7   Hematocrit (CBC)  49.2  Platelet Count (CBC) 269 (Result(s) reported on 28 Mar 2012 at 12:44AM.)   MCV 93   MCH 29.6   MCHC  31.8   RDW 12.9   Radiology Results: CT:    19-Jul-13 07:42, CT Abdomen and Pelvis With Contrast   CT Abdomen and Pelvis With Contrast   PRELIMINARY REPORT    The following is a PRELIMINARY Radiology report.  A final report will follow pending radiologist verification.      REASON FOR EXAM:    (1) generalized abdominal pain and vomiting; (2)   generalized abdominal pain and  COMMENTS:       PROCEDURE: CT  - CT ABDOMEN / PELVIS  W  - Mar 28 2012  7:42AM     RESULT: Comparison is made to a prior study dated 02/10/2012.    TECHNIQUE: Helical 3 mm sections were obtained from the lung bases   through the pubic symphysis status post intravenous administration of 85   ml of Isovue-300.    FINDINGS: Evaluation of the lung bases demonstrates emphysematous and   fibrotic changes.    The liver, spleen, adrenals,  pancreas, and right kidney are unremarkable.   A small, low attenuating focus is identified along the posterior midpole   region of the left kidney likely representing a cyst. Larger exophytic   cysts are also identified within the left kidney. These findings are   stable.    Multiple, dilated fluid and contrast filled loops of small bowel with   air-fluid levels are identified. A ventral abdominal wall hernia is   identified with an area of herniated bowel causing a component of   obstruction is of diagnostic consideration. There is otherwise no   evidence of abdominal free fluid, loculated fluid collections, masses or   adenopathy. There is no evidence of an abdominal aortic aneurysm. A small   to moderate amount of stool is appreciated within the colon without   evidence of large bowel obstruction or colitis. There is diverticulosis   within the descending and sigmoid colon regions. The celiac, SMA, IMA,     portal vein, and SMV are opacified. The patient is status post   cholecystectomy and there is mild intrahepatic biliary ductal dilatation.   The common bile duct is dilated consistent with the patient's history and   age.     The loops of bowel contained within the hernia do not appear to be thick   walled and there is no evidence of significant surrounding fluid. Minimal   inflammatory change is identified within the surrounding fat which may   represent a component of inflammation.    IMPRESSION:     1.  Small bowel obstruction a component of which may be secondary to the   ventral wall abdominal hernia. A transitionpoint and/or adhesion within   the pelvis is also of diagnostic consideration.  2.  These findings were discussed with Dr. Benjaman Lobe of the Emergency   Department at the time of initial interpretation. Surgical consultation   is recommended, if clinically warranted.    Thank you for this opportunity to contribute to the care of your patient.            Dictated By: Mikki Santee, M.D., MD     Assessment/Admission Diagnosis CT rev'd will admit, NG, hydrate reexamine serial KUB SBO may not be related to [facility]   Electronic Signatures: Florene Glen (MD)  (Signed 19-Jul-13 09:06)  Authored: CHIEF COMPLAINT and HISTORY, PAST MEDICAL/SURGIAL HISTORY, ALLERGIES,  FAMILY AND SOCIAL HISTORY, REVIEW OF SYSTEMS, PHYSICAL EXAM, LABS, Radiology, ASSESSMENT AND PLAN   Last Updated: 19-Jul-13 09:06 by Florene Glen (MD)

## 2014-12-28 NOTE — Discharge Summary (Signed)
PATIENT NAME:  Diana Meadows, Diana Meadows MR#:  [id number] DATE OF BIRTH:  [birth year]  DATE OF ADMISSION:  03/28/2012 DATE OF DISCHARGE:  03/30/2012  DIAGNOSES:  1. Abdominal pain. 2. Partial small bowel obstruction. 3. Recurrent ventral hernia. 4. Vitamin D deficiency.   PROCEDURES: None.   HISTORY OF PRESENT ILLNESS/HOSPITAL COURSE: This is a patient with a history of a recurrent ventral hernia repair by Dr. Egbert GaribaldiBird in October 2012. She presents with another recurrence and a small bowel obstruction with nausea and vomiting and abdominal pain. At the time of admission it did not appear that the bowel obstruction on CT scan was due to a loop of bowel in the recurrent ventral hernia in the infraumbilical area. In fact, the hernia itself was reducible and nontender but there was what appeared to be adhesive small bowel obstruction located in the pelvis with a dilated loop of contrast filled small bowel in the pelvis and collapsed bowel exiting.   Patient was admitted to the hospital with a diagnosis of partial small bowel obstruction secondary to adhesive disease as well as a recurrent ventral hernia. A nasogastric tube was placed. She promptly resolved her small bowel obstruction, was passing gas and having bowel movements and her recurrent ventral hernia room remained soft, nontender and reducible. She is currently tolerating a regular diet and is discharged in stable condition to follow up with Dr. Egbert GaribaldiBird for consideration of repair of a recurrent ventral hernia and possible adhesiolysis if there is a loop of bowel in the pelvis that could be causing this small bowel obstruction which has resolved. Patient understands all this. She has an appointment with GI next week and will follow up in my office with Dr. Egbert GaribaldiBird after that. She is given no new medications.  ____________________________ Adah Salvageichard E. Excell Seltzerooper, MD rec:cms D: 03/30/2012 07:37:39 ET T: 03/31/2012 11:27:42 ET  JOB#: [id number] cc: Adah Salvageichard E. Excell Seltzerooper, MD,  <Dictator>  Lattie HawICHARD E COOPER MD ELECTRONICALLY SIGNED 03/31/2012 11:37

## 2014-12-28 NOTE — Op Note (Signed)
PATIENT NAME:  Diana Meadows, Diana Meadows DATE OF BIRTH:  August 31, 1919  DATE OF PROCEDURE:  05/26/2012  PREOPERATIVE DIAGNOSES:  1. Incisional ventral hernia. 2. Recurrent partial small bowel obstruction.   POSTOPERATIVE DIAGNOSES:  1. Incisional ventral hernia. 2. Recurrent partial small bowel obstruction.   PROCEDURES PERFORMED:  1. Exploratory laparotomy.  2. Lysis of adhesions.  3. Repair of ventral hernia with 8 x 10 cm dual mesh.   SURGEON: Jorge Amparo A. Egbert GaribaldiBird, Diana Meadows   ASSISTANT: Hulda Marinimothy Oaks, Diana Meadows   TYPE OF ANESTHESIA: General endotracheal.   FINDINGS: There were no signs of adhesion causing small bowel obstruction. There were some adhesions in the right lower quadrant of the terminal ileum to the pelvic sidewall which were taken down. There were omental adhesions to the anterior abdominal wall. There was no evidence of dilated bowel. No stricture seen in the small bowel. The hernia defect measured approximately 2.5 cm in size and would easily admit loops of small bowel and likely causing intermittent bowel obstruction.   SPECIMEN: Hernia sac.   ESTIMATED BLOOD LOSS: Minimal.   DRAINS: None.   LAP AND NEEDLE COUNT: Correct x2.   DESCRIPTION OF PROCEDURE: With the patient in the supine position, general endotracheal anesthesia was induced. Her abdomen was widely prepped and draped with ChloraPrep solution. Collier Flowersoban was placed. Foley catheter was placed. Time-out was observed.   Incision was fashioned just above the previous scar with scalpel and carried down through musculofascial layers with sharp dissection. The peritoneum was entered sharply. Adhesiolysis was undertaken the length of the incision. Just to the right of the umbilicus and slightly below it was a hernia defect. The hernia sac was then excised. Adhesiolysis was then performed in the right lower quadrant. A significant amount of omental adhesions were taken down with sharp technique off the anterior abdominal wall. Right  lower quadrant appeared to be unremarkable. The appendix was normal. The gallbladder was surgically absent. I left some omental adhesions in the right upper quadrant. The stomach was unremarkable.   The fascia was then released off the subcutaneous tissues anteriorly. I could not enter the preperitoneal space due to multiple previous operations.   An 8 x 10 cm dual mesh was brought onto the field and then secured to the undersurface of the fascia with interrupted mattress sutures of #1 Vicryl suture. Superiorly the fascia was then closed for several centimeters with #1 Vicryl sutures and interrupted #1 Vicryl sutures as internal retention sutures. The superior aspect of the mesh was then incorporated into that closure. With lap and needle count correct x2, the operative field was irrigated with normal saline and aspirated dry. Hemostasis appeared to be adequate. The anterior fascia was loosely reapproximated in the midline with #1 Vicryl suture. Subcutaneous tissues were again irrigated. Hemostasis was ensured with point cautery at several points. Scarpa's fascia was reapproximated with 2-0 Vicryl suture. Skin edges were reapproximated utilizing a skin stapler followed by a sterile occlusive dressing and an abdominal binder. The patient was then subsequently extubated and taken to the recovery room in stable and satisfactory condition by anesthesia services.   ____________________________ Redge GainerMark A. Egbert GaribaldiBird, Diana Meadows FACS mab:drc D: 05/27/2012 07:39:16 ET T: 05/27/2012 10:48:07 ET JOB#: [id number]  cc: Diana LericheMark A. Egbert GaribaldiBird, Diana Meadows, <Dictator> Raynald KempMARK A Kursten Kruk Diana Meadows ELECTRONICALLY SIGNED 05/29/2012 18:21

## 2014-12-28 NOTE — Consult Note (Signed)
PATIENT NAME:  Diana Meadows, Diana Meadows MR#:  [id number] DATE OF BIRTH:  1919/08/15  DATE OF CONSULTATION:  04/30/2012  REFERRING PHYSICIAN:  Dr. Michela Pitcher CONSULTING PHYSICIAN:  Keturah Barre, NP  HISTORY OF PRESENT ILLNESS:   Ms. Boateng is a 81 year old Caucasian woman with a history of multiple abdominal hernias, hernia repair, cholecystectomy, and pancreatitis. She was admitted on 08/20 for abdominal pain.  GI has been consulted at the request of Dr. Michela Pitcher to evaluate abdominal pain. She has been evaluated in the recent past at the GI clinic at [facility] and has been on the schedule for EGD and colonoscopy for evaluation of same. She states she is under consideration for repeat abdominal hernia repair. Evidently the only luminal evaluation she has had is flexible sigmoidoscopy 20 years ago that had negative findings. She states that her abdominal pain travels all the way across her upper abdomen and down and to the  right into the lower quadrant. It is of an intermittent nature and varies in intensity. It increased yesterday, which prompted her Emergency Department visit. Controlling her chronic constipation helps to make it better. She takes Dulcolax and prune juice daily to do this. Lying down and being still also helps this. Alternatively, constipation makes it worse. The pain has been present for the last 3 to 4 months. She has been hospitalized in this time also with pancreatitis. However, there is no apparent indication that this is so at present. Denies chronic NSAID use, acid reflux, heartburn, problems swallowing, nausea, vomiting, diarrhea, black, tarry, bloody stools, or changes in bowel habits. States no history of liver disease and did have a ventral hernia repair last autumn.   PAST MEDICAL HISTORY:  1. Pancreatitis. 2. Cataracts.  3. Depression.  4. Osteoporosis.   PAST SURGICAL HISTORY:  1. Cataract removal. 2. C-section times three.  3. Cholecystectomy.  4. Hiatal hernia repair.   5. Hysterectomy with a bilateral salpingo-oophorectomy.  6. Tonsillectomy.  7. Septoplasty.  8. Ear surgery. 9. Axillary surgery. 10. Left inguinal hernia repair.   MEDICATIONS:  1. Tylenol Extra Strength 2 caps p.o. b.i.d.  p.r.n.  2. Vitamin D3 2000 international units  p.o. daily.  3. Cyclobenzaprine 5 mg p.o. at bedtime p.r.n.   ALLERGIES: Codeine, NSAIDs, and Ultracet.   FAMILY HISTORY: Pertinent for tuberculosis and lymphoma. Denies history of colon cancer, colon polyps, peptic ulcer disease, or liver disease.   SOCIAL HISTORY: Widowed with three children. No tobacco or illicits. She was drinking 3 to 4 alcoholic beverages a night for several years; however, she states she has not had any alcohol over the summer.  REVIEW OF SYSTEMS: Ten-point review negative except that which is noted above.  MOST RECENT LAB WORK: Glucose 103, BUN 13, creatinine 0.87, sodium 140, potassium 4. GFR 58, calcium 8.6. Lipase 122, protein 7.4, albumin 3.2, bilirubin 0.5, alkaline phosphatase 142, AST 32, ALT 19. WBC 8.4, hemoglobin 14.6, hematocrit 44.3, platelet count 270. Red cells are normocytic. Urinalysis was unremarkable. EKG with normal sinus rhythm, normal EKG. She did have a CT scan on 08/20 of the abdomen and pelvis with diverticula to the duodenum, diverticula to the sigmoid and descending colon, and a small ventral abdominal hernia.   PHYSICAL EXAMINATION:  VITAL SIGNS: Most recent vital signs: Temperature 98.3, pulse 73, respiratory rate 16, blood pressure 102/53, and oxygen saturation 91% on room air.   GENERAL: Pleasant, cooperative. No acute distress.   PSYCH: Logical thought, good memory.   HEENT: Normocephalic, atraumatic. Does have a scleral  hemorrhage, which she relates to a recent shot in the eye for macular degeneration. No other redness, drainage, or inflammation to the eyes or the nares. Oral mucous membranes are pink and moist.   NECK: Supple. No adenopathy, thyromegaly, or  JVD.   RESPIRATORY: Respirations eupneic. Lungs clear to auscultation and percussion.   CARDIAC: S1 and S2. Regular rate and rhythm. No murmurs, rubs, or gallops. No appreciable edema. Pulses 2+ radially bilaterally.  ABDOMEN: Flat, soft, bowel sounds times four.  Mild tenderness to the epigastrium and right side. No rebound tenderness, guarding, hepatosplenomegaly, masses, or peritoneal signs.   GENITOURINARY: Deferred.   RECTAL: Deferred.   EXTREMITIES: Moves all extremities well times four. No clubbing or cyanosis.   NEUROLOGICAL: Alert and oriented times three. Cranial nerves appear to be intact with the exception of mild bilateral hearing loss. Speech clear. No facial droop.   IMPRESSION AND PLAN: Abdominal pain, bilateral upper quadrant and right lower quadrant. Agree with proton pump inhibitor therapy. Labs with mild ALT elevation, but otherwise unremarkable. No indication of pancreatitis this visit. We will plan for EGD and colonoscopy. I have discussed the risks and benefits with her and her daughter and they are agreeable. We will plan this as clinically feasible.   These services were provided by Vevelyn Pathristiane Donyelle Enyeart, MSN, [facility] in collaboration with Dr. Lutricia FeilPaul Oh with whom I have discussed this patient in full.    ____________________________ Keturah Barrehristiane H. Mally Gavina, NP chl:bjt D: 04/30/2012 18:52:14 ET T: 04/30/2012 19:51:14 ET JOB#: [id number]  cc: Keturah Barrehristiane H. Kaisei Gilbo, NP, <Dictator> Eustaquio MaizeHRISTIANE H Zema Lizardo FNP ELECTRONICALLY SIGNED 05/06/2012 16:21

## 2015-01-01 NOTE — Discharge Summary (Signed)
PATIENT NAME:  Diana Meadows, Diana Meadows MR#:  [id number] DATE OF BIRTH:  10/22/1918  DATE OF ADMISSION:  04/20/2014 DATE OF DISCHARGE:  04/30/2014  ATTENDING PHYSICIAN: Dr. Salome Holmeshris Helmut Hennon.  DISCHARGE DIAGNOSES:  1. Small bowel obstruction secondary to recurrent ventral hernia.  2. History of ventral hernia with mesh in 2013 and primary ventral hernia repair in 2012.  3. History of cesarean section.  4. History of hysterectomy.  5. History of cholecystectomy.  6. History of respiratory infection.  7. History of hiatal hernia.  8. History of depression.   PROCEDURE PERFORMED: Repair of incarcerated ventral hernia 04/24/2014.   DISCHARGE MEDICATIONS: As follows:  1. Estradiol 0.5 p.o. daily.  2. Vitamin D 3000 one cap p.o. daily. 3. Magnesium gluconate 1 tab p.o. daily.  4. Tylenol 325 one tab p.o. q. 6 hours p.r.n.  5. MiraLax 17 p.o. daily.  INDICATION FOR ADMISSION: Ms. Diana Meadows is a pleasant, 81 year old with multiple ventral hernia repairs who presents with a small bowel obstruction secondary to recurrent ventral hernia.   HOSPITAL COURSE: As follows: Ms. Diana Meadows was admitted and NG tube was placed on August 10. Although she did have some improvement she continued to have pain associated with the hernia; therefore, had a hernia repair surgery on August 15. Postoperatively, she was given liquids and a PCA for pain control. Throughout hospital course, her bowel function resumed. She began having bowel movements and passing flatus. Therefore, her diet was advanced to regular and she was transitioned from IV to oral pain medications. At the time of discharge was taking good p.o. with good p.o. pain control and was voiding and stooling without difficulty.   DISCHARGE INSTRUCTIONS: As follows: Ms. Diana Meadows is to call or return to the ED if has increased pain, nausea, vomiting, redness, drainage from incision.    ____________________________ Si Raiderhristopher A. Hutson Luft, MD cal:lt D: 05/15/2014  19:44:37 ET T: 05/15/2014 22:44:29 ET JOB#: [id number]  cc: Cristal Deerhristopher A. Rannie Craney, MD, <Dictator> Jarvis NewcomerHRISTOPHER A Katara Griner MD ELECTRONICALLY SIGNED 05/27/2014 10:31

## 2015-01-01 NOTE — H&P (Signed)
   Subjective/Chief Complaint Abdominal pain, midline radiating up right side to epigastrium   History of Present Illness Diana Meadows is a pleasant 81 yo F with a history of multiple abdominal surgeries and multiple abdominal hernia repairs, last in 2013 with mesh who presents with approx 5 days of nonresolving abdominal pain.   Began acutely, minimal in am, worse after breakfast, better in remaining part of day after taking tylenol (up to 3 times per day).  Has been having normal bm with daily milk of magnesia.  Loose BM x 3 today.  Vomiting following PO contrast today.  Has had similar pain since 2010 which has been improved with milk of magnesia.  No fevers/chills.  Pain minimal now.  Does report feeling a lump in her midline which is mildly tender.   Past History H/o ventral hernia repair with mesh 2013 H/o primary VHR 2012 H/o c-sxn H/o cholecystectomy H/o hysterctomy H/o URI Hiatal hernia H/o depression   Past Med/Surgical Hx:  Depression:   Partial Bowel Obstruction:   HOH/Bil. Aids:   Hiatal Hernia:   Ear surgery:   Septoplasty:   cataract surgery:   Cesarean Section: x3  Cholecystectomy:   Hysterectomy:   Hernia Repair:   ALLERGIES:  Codeine: GI Distress  NSAIDS: GI Distress  [place] Cold & Flu: GI Distress  Family and Social History:  Family History TB father, Lymphoma mother   Social History negative tobacco, positive ETOH, Social EtOH   Place of Living Here with roomate   Review of Systems:  Subjective/Chief Complaint Abdominal pain, N/V x 1 day   Fever/Chills No   Cough No   Sputum No   Abdominal Pain Yes   Diarrhea Yes   Constipation No   Nausea/Vomiting Yes   SOB/DOE No   Chest Pain No   Dysuria No   Tolerating PT No   Tolerating Diet Nauseated  Vomiting   Physical Exam:  GEN well developed, well nourished, no acute distress   HEENT pink conjunctivae, PERRL, hearing intact to voice, poor dentition   RESP normal resp effort  clear  BS  no use of accessory muscles   CARD regular rate  no murmur  no thrills   ABD positive tenderness  positive hernia  normal BS  no Abdominal Bruits  no Adominal Mass  Tender to palpation, abdominal wall swelling right of midline,   EXTR negative cyanosis/clubbing, negative edema   SKIN normal to palpation, No rashes, skin turgor good   NEURO cranial nerves intact, negative rigidity, negative tremor, follows commands, cog wheel, strength:, motor/sensory function intact   PSYCH A+O to time, place, person, good insight    Assessment/Admission Diagnosis Diana Meadows is a pleasant 81 yo F with a history of multiple SBO s/p multiple ventral hernia repairs. Presents with abdominal pain, ventral hernia.  Some dilation upstream with collapse downstream.  I feel that her pain and hernia are chronic but concern for incarcerated recurrent ventral hernia.  No peritonitis, minimal N/V, AF/VSS, labs relatively unremarkable.   Plan Admit, NPO, IVF.  KUB in am.  Will discuss with Dr. Egbert GaribaldiBird, who has operated on her on multiple occasions to evaluate Diana Meadows as well.   Electronic Signatures: Jarvis NewcomerLundquist, Will Heinkel A (MD)  (Signed 10-Aug-15 18:49)  Authored: CHIEF COMPLAINT and HISTORY, PAST MEDICAL/SURGIAL HISTORY, ALLERGIES, FAMILY AND SOCIAL HISTORY, REVIEW OF SYSTEMS, PHYSICAL EXAM, ASSESSMENT AND PLAN   Last Updated: 10-Aug-15 18:49 by Jarvis NewcomerLundquist, Debraann Livingstone A (MD)

## 2015-01-01 NOTE — Op Note (Signed)
PATIENT NAME:  Diana Meadows, Solstice V MR#:  [id number] DATE OF BIRTH:  [birth year]  DATE OF PROCEDURE:  04/24/2014  PREOPERATIVE DIAGNOSIS: Bowel obstruction secondary to recurrent incarcerated ventral hernia.   POSTOPERATIVE DIAGNOSIS: Bowel obstruction secondary to recurrent incarcerated ventral hernia.   PROCEDURE PERFORMED: Repair of recurrent ventral hernia with an approximately 10 x 7 cm Ventralex ST ultralight coated mesh.  DETAILS OF THE PROCEDURE: Informed consent was obtained. Ms. Loura HaltGillham was brought to the operating room suite, and she was induced. Endotracheal tube was placed. General anesthesia was administered. Her abdomen was then prepped and draped in standard surgical fashion. A timeout was then performed correctly identifying the patient name, operative site and procedure to preformed.   An incision over the hernia was made. It was deepened down to the hernia. There was approximately a 3 x 3 cm defect with some bowel. This was reduced. The hernia sac was entered and dissected. The defect was approximately 3 x 3 cm. There was a significant amount of bowel to the abdominal wall into the previously placed mesh, which was taken down. I did have to open the incision to the area of the previous mesh repair to be able to take down the bowel carefully. I then placed an approximately 10 x 7 cm piece of mesh, which was sutured circumferentially to the fascia with interrupted 0 Prolene sutures. I then irrigated the wound, closed the fascia with a running looped #1 PDS. I decided to not make it drain due to the fact that the dead space was not significantly large. Staples were used to close the wound.   The patient was then awoken, extubated and brought to the postanesthesia care unit. There were no immediate complications. Needle, sponge, and instrument count was correct at the end of the procedure.    ____________________________ Si Raiderhristopher A. Alijah Hyde, MD cal:JT D: 04/25/2014 09:57:00  ET T: 04/25/2014 11:47:33 ET JOB#: [id number]  cc: Cristal Deerhristopher A. Chiyeko Ferre, MD, <Dictator> Jarvis NewcomerHRISTOPHER A Sophiana Milanese MD ELECTRONICALLY SIGNED 05/03/2014 21:09

## 2015-01-01 NOTE — H&P (Signed)
Subjective/Chief Complaint Shortness of breath and chest pain   History of Present Illness Elderly lady presented with c/o shortness of breath and chest pain. Also c/o cough and stuffy nose since yesterday. Chest pain is intermittent, retrosternal, non radiating, 6-7/10 severity. No fever or chills. Patient found to be hypoxic with room air O2 saturation of 89%, CXR was negative. Normal BNP=206. Rapid flu test was negative. No leukocytosis. No past h/o CAD or MI.   Past History HTN, diet controlled Depression Osteoporosis Cataract   Primary Physician Dr. Frazier Richards   Past Med/Surgical Hx:  Depression:   Partial Bowel Obstruction:   HOH/Bil. Aids:   Hiatal Hernia:   Ear surgery:   Septoplasty:   cataract surgery:   Cesarean Section: x3  Cholecystectomy:   Hysterectomy:   Hernia Repair:   ALLERGIES:  Codeine: GI Distress  NSAIDS: GI Distress  Nyquil Cold & Flu: GI Distress  HOME MEDICATIONS: Medication Instructions Status  cyclobenzaprine 5 mg oral tablet 1 tab(s) orally once a day Active  estradiol 0.5 mg oral tablet 1 tab(s) orally once a day Active   Family and Social History:  Family History TB-father, Lymphoma-mother   Social History negative tobacco, negative ETOH   Review of Systems:  Fever/Chills No   Cough Yes   Sputum No   Abdominal Pain No   Diarrhea No   Constipation No   Nausea/Vomiting No   SOB/DOE Yes   Chest Pain Yes   Dysuria No   Tolerating Diet Yes   Medications/Allergies Reviewed Medications/Allergies reviewed   Physical Exam:  GEN disheveled   HEENT pink conjunctivae   NECK supple  trachea midline   RESP normal resp effort  clear BS  no use of accessory muscles   CARD regular rate  No LE edema  no JVD   ABD denies tenderness  soft  normal BS   EXTR negative edema   SKIN skin turgor good   NEURO motor/sensory function intact   PSYCH A+O to time, place, person, good insight   Lab Results: Hepatic:   22-Jan-15 14:10   Bilirubin, Total 0.5  Alkaline Phosphatase 98 (45-117 NOTE: New Reference Range 07/31/13)  SGPT (ALT) 26  SGOT (AST)  46  Total Protein, Serum 7.0  Albumin, Serum  3.2  Routine Micro:  22-Jan-15 14:28   Micro Text Report INFLUENZA A+B ANTIGENS   COMMENT                   NEGATIVE FOR INFLUENZA A (ANTIGEN ABSENT)   COMMENT                   NEGATIVE FOR INFLUENZA B (ANTIGEN ABSENT)   ANTIBIOTIC                       Comment 1.. NEGATIVE FOR INFLUENZA A (ANTIGEN ABSENT) A negative result does not exclude influenza. Correlation with clinical impression is required.  Comment 2.. NEGATIVE FOR INFLUENZA B (ANTIGEN ABSENT)  Result(s) reported on 01 Oct 2013 at 03:11PM.  Routine Chem:  22-Jan-15 14:10   B-Type Natriuretic Peptide ([facility]) 206 (Result(s) reported on 01 Oct 2013 at 02:49PM.)  Glucose, Serum  103  BUN 13  Creatinine (comp) 0.92  Sodium, Serum 136  Potassium, Serum 4.2  Chloride, Serum 104  CO2, Serum 30  Calcium (Total), Serum  8.4  Osmolality (calc) 272  eGFR (African American) >60  eGFR (Non-African American)  54 (eGFR values <34m/min/1.73 m2 may be an  indication of chronic kidney disease (CKD). Calculated eGFR is useful in patients with stable renal function. The eGFR calculation will not be reliable in acutely ill patients when serum creatinine is changing rapidly. It is not useful in  patients on dialysis. The eGFR calculation may not be applicable to patients at the low and high extremes of body sizes, pregnant women, and vegetarians.)  Anion Gap  2  Cardiac:  22-Jan-15 14:10   Troponin I < 0.02 (0.00-0.05 0.05 ng/mL or less: NEGATIVE  Repeat testing in 3-6 hrs  if clinically indicated. >0.05 ng/mL: POTENTIAL  MYOCARDIAL INJURY. Repeat  testing in 3-6 hrs if  clinically indicated. NOTE: An increase or decrease  of 30% or more on serial  testing suggests a  clinically important change)  CK, Total 49  CPK-MB, Serum 0.8 (Result(s)  reported on 01 Oct 2013 at 02:49PM.)    18:41   Troponin I < 0.02 (0.00-0.05 0.05 ng/mL or less: NEGATIVE  Repeat testing in 3-6 hrs  if clinically indicated. >0.05 ng/mL: POTENTIAL  MYOCARDIAL INJURY. Repeat  testing in 3-6 hrs if  clinically indicated. NOTE: An increase or decrease  of 30% or more on serial  testing suggests a  clinically important change)  Routine Hem:  22-Jan-15 14:10   WBC (CBC) 6.8  RBC (CBC) 4.59  Hemoglobin (CBC) 14.0  Hematocrit (CBC) 42.7  Platelet Count (CBC) 187 (Result(s) reported on 01 Oct 2013 at 02:39PM.)  MCV 93  MCH 30.5  MCHC 32.8  RDW 13.3   Radiology Results: XRay:    22-Jan-15 13:49, Chest PA and Lateral  Chest PA and Lateral  REASON FOR EXAM:    cough   chest pain  COMMENTS:       PROCEDURE: DXR - DXR CHEST PA (OR AP) AND LATERAL  - Oct 01 2013  1:49PM     CLINICAL DATA:  Cough.    EXAM:  CHEST  2 VIEW    COMPARISON:  06/05/2012.    FINDINGS:  Mediastinum and hilar structures are normal. Lungs are clear of  acute infiltrates. Hyperexpansion of both lung fields consistent  with COPD. Mild apical pleural thickening consistent with scarring.  No pleural effusion or pneumothorax. No acute osseous abnormality.  Degenerative changes thoracic spine. Surgical clips right upper  quadrant.     IMPRESSION:  No active cardiopulmonary disease.      Electronically Signed    By: Marcello Moores  Register    On: 10/01/2013 14:01         Verified By: Osa Craver, M.D., MD    Assessment/Admission Diagnosis Elderly lady admitted for chest pain, r/o MI. Shortness of breath and hypoxia, r/o PE. Also c/o cough and nasal congestion, acute bronchitis.   Plan - Chest pain: admit to telemetry, serial cardiac enzymes, negative troponin x 2 noted, start Coreg, Aspirin held for allergy to   NSAIDs.    -Shortness of breath and hypoxia, O2 nasal cannula, Duoneb Q 4 hrs, CT chest to r/o PE. ECHO in AM.  -Acute bronchitis: started on  Azithromycin  -DVT prophylaxis: Lovenox  -GI prophylaxis: Protonix   Electronic Signatures: Glendon Axe (MD)  (Signed 22-Jan-15 20:36)  Authored: CHIEF COMPLAINT and HISTORY, PAST MEDICAL/SURGIAL HISTORY,ALLERGIES, HOME MEDICATIONS, FAMILY AND SOCIAL HISTORY, REVIEW OF SYSTEMS, PHYSICAL EXAM, LABS, Radiology, ASSESSMENT AND PLAN   Last Updated: 22-Jan-15 20:36 by Glendon Axe (MD)

## 2015-01-01 NOTE — Discharge Summary (Signed)
PATIENT NAME:  Diana Meadows, [name] MR#:  [id number] DATE OF BIRTH:  [birth year]  DATE OF ADMISSION:  10/03/2013 DATE OF DISCHARGE:  10/05/2013  Admitted 10/01/2013 to observation bed, converted to full admission 10/03/2013.   DISCHARGE DIAGNOSES: 1. Acute respiratory failure from asthma flare and upper respiratory infection.  2. Chest pain, musculoskeletal from cough from above.  3. Accelerated hypertension from the steroids.   DISCHARGE MEDICATIONS: [facility]. Please see for details. Basically,  he is on a prednisone taper and she has finished her antibiotics here.  Mucinex p.r.n. Tussionex p.r.n.   HISTORY AND PHYSICAL: Please see detailed history and physical done on admission.   HOSPITAL COURSE: The patient was admitted with chest pain with coughing, wheezing, shortness of breath, hypoxic. She was started on steroids, weaned off oxygen. Breathing well at this point without difficulty. She is ambulating well without further complications or weakness. Work-up was unremarkable including chest x-ray. It should be ensured that she is not hypoxic prior to discharge today. Her echocardiogram showed normal LV function. Flu swab was, in fact, negative. CT of her chest showed some emphysema but no pulmonary emboli.  TIME SPENT: It took approximately 33 minutes to do all discharge tasks today.    ____________________________ Marya AmslerMarshall W. Dareen PianoAnderson, MD mwa:sg D: 10/05/2013 08:12:01 ET T: 10/05/2013 10:46:15 ET JOB#: [id number]  cc: Marya AmslerMarshall W. Dareen PianoAnderson, MD, <Dictator> Lauro RegulusMARSHALL W Jemell Town MD ELECTRONICALLY SIGNED 10/06/2013 7:41

## 2015-01-02 NOTE — Consult Note (Signed)
PATIENT NAME:  Diana Meadows, Diana Meadows MR#:  [id number] DATE OF BIRTH:  [birth year]  DATE OF CONSULTATION:  02/11/2012  REFERRING PHYSICIAN:  Einar CrowMarshall Anderson, MD CONSULTING PHYSICIAN:  Lurline DelShaukat Stacie Knutzen, MD  REASON FOR CONSULTATION: Acute abdominal pain, pancreatitis.   HISTORY OF PRESENT ILLNESS: The patient is a 81 year old female with no prior history of pancreatitis who came to the Emergency Room yesterday with a few hours of mid to upper abdominal pain. The pain was not associated with nausea or vomiting. She denies any change in bowel habits. She denies any fever or chills. Lipase was elevated to 2000 and CT scan did not show an abnormal pancreas. As of today, the patient's abdominal pain has totally resolved, lipase is normal. She was given a soft diet this afternoon after which she has developed some left upper quadrant discomfort which is kind of constant and is very positional. She still denies any nausea, vomiting, or diarrhea.   PAST SURGICAL HISTORY:  1. Hysterectomy. 2. Hernia repair. 3. Cholecystectomy.   HOME MEDICATIONS: Flexeril, esterase, and vitamin D.   ALLERGIES: Codeine and nonsteroidals.   SOCIAL HISTORY: She drinks about two glasses of wine per day.   FAMILY HISTORY: Positive for hypertension.   REVIEW OF SYSTEMS: Unremarkable except for what is mentioned in the History of Present Illness.   PHYSICAL EXAMINATION:   GENERAL: Very well built female who does not appear to be in any acute distress.   VITAL SIGNS: Her vitals are fairly stable with a temperature of 97.3, pulse 70, respirations 16, and blood pressure 101/58.   HEENT: Examination is unremarkable.   NECK: Neck veins are flat.   LUNGS: Grossly clear to auscultation bilaterally with fair air entry and no added sounds.   HEART: Regular rate and rhythm. No gallops or murmurs.   ABDOMEN: Slightly distended abdomen. No significant tenderness was noted, although she is complaining of some pain in the right upper  quadrant area. There is no rebound.   NEUROLOGIC: Examination appears to be unremarkable. There is no evidence of jaundice.   LABS/STUDIES: Lipase was 2,356 on admission. Electrolytes, BUN, creatinine, and liver enzymes were normal. Repeat lipase today is normal, although liver enzymes have gone up with an alkaline phosphatase of 157, ALT 80, and AST 98. Bilirubin is normal at 0.9. CBC is within normal limits.  Ultrasound of the abdomen showed the patient is status post cholecystectomy and the common bile duct is 8.9 mm which is normal for her age and the fact that she has had cholecystectomy, otherwise unremarkable ultrasound.    ASSESSMENT AND PLAN: The patient is with abdominal pain without nausea or vomiting. Her lipase was reported to be 2000 when the CT scan was absolutely normal. The patient's lipase came down to normal within less than 24 hours which is not consistent with acute pancreatitis. The patient is now complaining of some right-sided abdominal pain which is positional and may be musculoskeletal and related to her spine. The patient's AST, ALT, and alkaline phosphatase have gone up some raising some concerns about possible biliary sludge and passage of sludge that may have caused abdominal pain and rise in lipase, although this is purely a guess. The       patient's abdominal examination is quite benign. She is afebrile. I will repeat her liver enzymes as well as lipase tomorrow to have a better idea. If the patient continues to have significant abdominal pain. I will change her diet back to liquids. Further recommendations to follow.  ____________________________ Wynelle LinkShaukat  Niel Hummer, MD si:slb D: 02/11/2012 19:00:58 ET T: 02/12/2012 10:17:09 ET JOB#: [id number]  cc: Lurline Del, MD, <Dictator> Lurline Del MD ELECTRONICALLY SIGNED 02/14/2012 12:18

## 2015-01-02 NOTE — Consult Note (Signed)
Brief Consult Note: Diagnosis: ? Pancreatitis.   Patient was seen by consultant.   Consult note dictated.   Comments: Acute abdominal pain with high lipase that became normal in less than 24 hours, not consistent with acute pancreatitis. CT also negative for acute pancreatitis.  Slight elevation of AST/ ALT and Alkaline phosphatase today with RUQ discomfort which is positional.  Impression: Doubt acute pancreatitis. ? Biliary sludge with passage through ampulla. ? Musculoskeletal pain.  Recommendations: Continue soft diet. Repeat lipase in am. If continues to have significant abdominal pain, change back to liquid diet. Will follow. Thanks.  Electronic Signatures: Lurline DelIftikhar, Kiing Deakin (MD)  (Signed 03-Jun-13 18:55)  Authored: Brief Consult Note   Last Updated: 03-Jun-13 18:55 by Lurline DelIftikhar, Vickie Melnik (MD)

## 2015-01-02 NOTE — H&P (Signed)
PATIENT NAME:  Diana Meadows, Diana Meadows MR#:  [id number] DATE OF BIRTH:  02-26-1919  DATE OF ADMISSION:  02/10/2012  REFERRING PHYSICIAN: Dr. Governor Rooksebecca Lord  PRIMARY CARE PHYSICIAN: Dr. Einar CrowMarshall Anderson   REASON FOR ADMISSION: Abdominal pain with elevated lipase consistent with pancreatitis.   HISTORY OF PRESENT ILLNESS: The patient is a 81 year old female with no significant past medical history who presents to the Emergency Room with acute onset of epigastric and upper abdominal pain starting at 8:00 a.m. this morning. In the Emergency Room, the patient underwent CT which was essentially nondiagnostic except for a ventral abdominal wall hernia with nonobstructive small bowel in it. However, her lipase was greater than 2000 consistent with pancreatitis. She is nauseated and vomiting and is now admitted for further evaluation.   PAST MEDICAL HISTORY:  1. Status post hysterectomy.  2. Status post hernia repair.  3. Vitamin D deficiency.   MEDICATIONS:  1. Vitamin D 1000 units p.o. daily.  2. Estrace 0.5 mg p.o. daily.  3. Flexeril 5 mg p.o. daily.   ALLERGIES: Codeine and NSAIDs.   SOCIAL HISTORY: The patient drinks two glasses of wine per day. Remote history of tobacco abuse but none recently.   FAMILY HISTORY: Positive for hypertension, stroke, and coronary artery disease.   REVIEW OF SYSTEMS: CONSTITUTIONAL: No fever or change in weight. EYES: No blurred or double vision. No glaucoma. ENT: No tinnitus or hearing loss. No nasal discharge or bleeding. No difficulty swallowing. RESPIRATORY: No cough or wheezing. Denies hemoptysis. No painful respiration. CARDIOVASCULAR: No chest pain or orthopnea. No palpitations or syncope. GI: No diarrhea. No change in bowel habits. GU: No dysuria or hematuria. No incontinence. ENDOCRINE: No polyuria or polydipsia. No heat or cold intolerance. HEMATOLOGIC: The patient denies anemia, easy bruising, or bleeding. LYMPHATIC: No swollen glands. MUSCULOSKELETAL: The patient  denies pain in her neck, back, shoulders, knees, or hips. No gout. NEUROLOGIC: No numbness or weakness. Denies migraines, stroke, or seizures. PSYCH: The patient denies anxiety, insomnia, or depression.   PHYSICAL EXAMINATION:  GENERAL: The patient is in no acute distress.   VITAL SIGNS: Vital signs are remarkable for a blood pressure of 173/62 with a heart rate of 65 and a respiratory rate of 20. She is afebrile.   HEENT: Normocephalic, atraumatic. Pupils equally round and reactive to light and accommodation. Extraocular movements are intact. Sclerae are anicteric. Conjunctivae are clear.  Oropharynx is clear.  NECK: Supple without jugular venous distention. No adenopathy or thyromegaly is noted.   LUNGS: Clear to auscultation and percussion without wheezes, rales, or rhonchi. No dullness.   CARDIAC: Regular rate and rhythm. Normal S1, S2. No significant rubs, murmurs, or gallops. PMI is nondisplaced. Chest wall is nontender.   ABDOMEN: Soft but tender in the epigastrium and upper quadrants bilaterally. There is some guarding but no rebound. Normoactive bowel sounds. No organomegaly or masses were appreciated. No hernias or bruits were noted.   EXTREMITIES: Without clubbing, cyanosis, or edema. Pulses were 2+ bilaterally.   SKIN: Warm and dry without rash or lesions.   NEUROLOGIC: Cranial nerves II through XII grossly intact. Deep tendon reflexes were symmetric. Motor and sensory examinations nonfocal.   PSYCH: Exam revealed a patient who is alert and oriented to person, place, and time. She was cooperative and used good judgment.   LABORATORY, DIAGNOSTIC, AND RADIOLOGICAL DATA: Urinalysis was negative. Glucose was 123 with a BUN of 12 and a creatinine of 0.96 with a sodium of 143 and a potassium of 3.9 with a GFR  of 52. White count was 11.3 with a hemoglobin of 15.8. Lipase was 2356. Chest x-ray was unremarkable. CT of the abdomen revealed a ventral abdominal wall hernia with  nonobstructive loops of small bowel within it. Diverticulosis was also noted.   ASSESSMENT:  1. Acute pancreatitis.  2. Dehydration with hyperglycemia.  3. Nausea and vomiting.  4. Leukocytosis.   PLAN: The patient will be admitted to the floor with IV fluids and maintained on a clear liquid diet. We will hold her p.o. medicines at this time and begin IV Protonix. We will use Zofran and morphine as needed for nausea and pain. We will consult GI. Follow-up labs in the morning including lipase. Further treatment and evaluation will depend upon the patient's progress.   TOTAL TIME SPENT ON THIS PATIENT: 50 minutes.    ____________________________ Diana Meadows Judithann Sheen, MD jds:bjt D: 02/10/2012 14:36:17 ET T: 02/10/2012 14:46:20 ET JOB#: [id number]  cc: Diana Meadows. Judithann Sheen, MD, <Dictator> Diana Meadows. Dareen Piano, MD Diana Beavers Rodena Medin MD ELECTRONICALLY SIGNED 02/10/2012 16:27

## 2015-01-02 NOTE — Discharge Summary (Signed)
PATIENT NAME:  Diana Meadows, Diana Meadows MR#:  [id number] DATE OF BIRTH:  21-Jun-1919  DATE OF ADMISSION:  02/10/2012 DATE OF DISCHARGE:  02/12/2012  DISCHARGE DIAGNOSES:  1. Abdominal pain. 2. Likely pancreatitis.  3. Hepatitis, possible alcohol related.   DISCHARGE MEDICATIONS:  1. Prilosec 20 mg daily for two weeks and then discontinue. It should not be long-term at this point.  2. Estradiol 0.5 mg daily.  3. Cyclobenzaprine 5 mg at bedtime p.r.n. cramps.  4. Vitamin D 1000 international units daily.  5. Acetaminophen 500 mg as needed.   HISTORY AND PHYSICAL: Please see detailed history and physical done on admission.   HOSPITAL COURSE: The patient was admitted with abdominal pain and elevated lipase to 2000. This resolved quickly with the abdominal pain and the lipase being back to normal on the second draw. She did have some elevated liver enzymes with AST greater than ALT type split. These improved relatively quickly back to almost normal on today's date as well. CT and ultrasound of the abdomen did not show any abnormalities including normal pancreas and normal liver in appearance. She will need repeat liver enzymes and follow-up in the office in a week or two. Notably she had a ferritin, retic count, and B12 levels done which all were normal.           I did have a discussion to make sure she is not drinking more wine as she had had three to four glasses with dinner most nights up until recently.   TIME SPENT: It took approximately 33 minutes to do discharge tasks today. ____________________________ Marya AmslerMarshall W. Dareen PianoAnderson, MD mwa:slb D: 02/12/2012 07:47:57 ET T: 02/12/2012 14:13:40 ET JOB#: [id number]  cc: Marya AmslerMarshall W. Dareen PianoAnderson, MD, <Dictator> Lauro RegulusMARSHALL W Leahann Lempke MD ELECTRONICALLY SIGNED 02/13/2012 7:35

## 2015-03-17 ENCOUNTER — Telehealth: Payer: Self-pay | Admitting: Surgery

## 2015-03-17 NOTE — Telephone Encounter (Signed)
Call lisa at [facility] [phone]. Someone didn't mark enough months on the order form and ins. Is denying it.

## 2015-03-17 NOTE — Telephone Encounter (Signed)
Called Diana Meadows - she needed paperwork from October 2015, refaxed. This was done. Asked her to call if there was any other problem.

## 2016-09-14 ENCOUNTER — Encounter: Payer: Self-pay | Admitting: Emergency Medicine

## 2016-09-14 ENCOUNTER — Emergency Department: Payer: Medicare Other

## 2016-09-14 ENCOUNTER — Inpatient Hospital Stay
Admission: EM | Admit: 2016-09-14 | Discharge: 2016-09-17 | DRG: 193 | Disposition: A | Discharge status: Home Health | Payer: Medicare Other | Attending: Internal Medicine | Admitting: Internal Medicine

## 2016-09-14 DIAGNOSIS — J440 Chronic obstructive pulmonary disease with acute lower respiratory infection: Secondary | ICD-10-CM | POA: Diagnosis present

## 2016-09-14 DIAGNOSIS — Z885 Allergy status to narcotic agent status: Secondary | ICD-10-CM

## 2016-09-14 DIAGNOSIS — J189 Pneumonia, unspecified organism: Principal | ICD-10-CM | POA: Diagnosis present

## 2016-09-14 DIAGNOSIS — K219 Gastro-esophageal reflux disease without esophagitis: Secondary | ICD-10-CM | POA: Diagnosis present

## 2016-09-14 DIAGNOSIS — Z886 Allergy status to analgesic agent status: Secondary | ICD-10-CM

## 2016-09-14 DIAGNOSIS — Z90710 Acquired absence of both cervix and uterus: Secondary | ICD-10-CM

## 2016-09-14 DIAGNOSIS — F329 Major depressive disorder, single episode, unspecified: Secondary | ICD-10-CM | POA: Diagnosis present

## 2016-09-14 DIAGNOSIS — R0602 Shortness of breath: Secondary | ICD-10-CM | POA: Diagnosis not present

## 2016-09-14 DIAGNOSIS — Z87891 Personal history of nicotine dependence: Secondary | ICD-10-CM

## 2016-09-14 DIAGNOSIS — J9621 Acute and chronic respiratory failure with hypoxia: Secondary | ICD-10-CM | POA: Diagnosis present

## 2016-09-14 DIAGNOSIS — F32A Depression, unspecified: Secondary | ICD-10-CM | POA: Diagnosis present

## 2016-09-14 DIAGNOSIS — R0902 Hypoxemia: Secondary | ICD-10-CM | POA: Diagnosis present

## 2016-09-14 DIAGNOSIS — Z79899 Other long term (current) drug therapy: Secondary | ICD-10-CM

## 2016-09-14 DIAGNOSIS — M6281 Muscle weakness (generalized): Secondary | ICD-10-CM

## 2016-09-14 DIAGNOSIS — J449 Chronic obstructive pulmonary disease, unspecified: Secondary | ICD-10-CM | POA: Diagnosis present

## 2016-09-14 HISTORY — DX: Gastro-esophageal reflux disease without esophagitis: K21.9

## 2016-09-14 HISTORY — DX: Depression, unspecified: F32.A

## 2016-09-14 HISTORY — DX: Major depressive disorder, single episode, unspecified: F32.9

## 2016-09-14 HISTORY — DX: Chronic obstructive pulmonary disease, unspecified: J44.9

## 2016-09-14 LAB — COMPREHENSIVE METABOLIC PANEL
ALBUMIN: 3.7 g/dL (ref 3.5–5.0)
ALK PHOS: 88 U/L (ref 38–126)
ALT: 14 U/L (ref 14–54)
ANION GAP: 6 (ref 5–15)
AST: 28 U/L (ref 15–41)
BUN: 16 mg/dL (ref 6–20)
CO2: 32 mmol/L (ref 22–32)
Calcium: 8.7 mg/dL — ABNORMAL LOW (ref 8.9–10.3)
Chloride: 101 mmol/L (ref 101–111)
Creatinine, Ser: 0.87 mg/dL (ref 0.44–1.00)
GFR calc Af Amer: >60 mL/min (ref >60)
GFR calc non Af Amer: 55 mL/min — ABNORMAL LOW (ref >60)
GLUCOSE: 129 mg/dL — AB (ref 65–99)
POTASSIUM: 4 mmol/L (ref 3.5–5.1)
Sodium: 139 mmol/L (ref 135–145)
Total Bilirubin: 0.3 mg/dL (ref 0.3–1.2)
Total Protein: 7.3 g/dL (ref 6.5–8.1)

## 2016-09-14 LAB — CBC WITH DIFFERENTIAL/PLATELET
BASOS ABS: 0.4 10*3/uL — AB (ref 0–0.1)
Basophils Relative: 8 %
EOS ABS: 0 10*3/uL (ref 0–0.7)
Eosinophils Relative: 0 %
HCT: 45.8 % (ref 35.0–47.0)
HEMOGLOBIN: 15 g/dL (ref 12.0–16.0)
Lymphocytes Relative: 17 %
Lymphs Abs: 0.8 10*3/uL — ABNORMAL LOW (ref 1.0–3.6)
MCH: 31.4 pg (ref 26.0–34.0)
MCHC: 32.8 g/dL (ref 32.0–36.0)
MCV: 95.8 fL (ref 80.0–100.0)
MONOS PCT: 14 %
Monocytes Absolute: 0.7 10*3/uL (ref 0.2–0.9)
NEUTROS PCT: 61 %
Neutro Abs: 3 10*3/uL (ref 1.4–6.5)
Platelets: 182 10*3/uL (ref 150–440)
RBC: 4.78 MIL/uL (ref 3.80–5.20)
RDW: 13.6 % (ref 11.5–14.5)
WBC: 4.9 10*3/uL (ref 3.6–11.0)

## 2016-09-14 LAB — LACTIC ACID, PLASMA: Lactic Acid, Venous: 1.5 mmol/L (ref 0.5–1.9)

## 2016-09-14 LAB — RAPID INFLUENZA A&B ANTIGENS (ARMC ONLY): INFLUENZA B (ARMC): NEGATIVE

## 2016-09-14 LAB — BRAIN NATRIURETIC PEPTIDE: B Natriuretic Peptide: 54 pg/mL (ref 0.0–100.0)

## 2016-09-14 LAB — RAPID INFLUENZA A&B ANTIGENS: Influenza A (ARMC): NEGATIVE

## 2016-09-14 MED ORDER — ESTRADIOL 1 MG PO TABS
0.5000 mg | ORAL_TABLET | Freq: Every day | ORAL | Status: DC
  Administered 2016-09-15 – 2016-09-17 (×3): 0.5 mg via ORAL
  Filled 2016-09-14 (×2): qty 1
  Filled 2016-09-14: qty 0.5

## 2016-09-14 MED ORDER — SODIUM CHLORIDE 0.9% FLUSH
3.0000 mL | Freq: Two times a day (BID) | INTRAVENOUS | Status: DC
  Administered 2016-09-14 – 2016-09-16 (×4): 3 mL via INTRAVENOUS

## 2016-09-14 MED ORDER — DEXTROSE 5 % IV SOLN
500.0000 mg | INTRAVENOUS | Status: DC
  Administered 2016-09-14 – 2016-09-16 (×3): 500 mg via INTRAVENOUS
  Filled 2016-09-14 (×4): qty 500

## 2016-09-14 MED ORDER — ALBUTEROL SULFATE (2.5 MG/3ML) 0.083% IN NEBU
5.0000 mg | INHALATION_SOLUTION | Freq: Once | RESPIRATORY_TRACT | Status: AC
  Administered 2016-09-14: 5 mg via RESPIRATORY_TRACT
  Filled 2016-09-14: qty 6

## 2016-09-14 MED ORDER — ONDANSETRON HCL 4 MG/2ML IJ SOLN: 4.0000 mg | Freq: Four times a day (QID) | INTRAMUSCULAR | Status: DC | PRN

## 2016-09-14 MED ORDER — IPRATROPIUM-ALBUTEROL 0.5-2.5 (3) MG/3ML IN SOLN
3.0000 mL | Freq: Once | RESPIRATORY_TRACT | Status: AC
  Administered 2016-09-14: 3 mL via RESPIRATORY_TRACT
  Filled 2016-09-14: qty 3

## 2016-09-14 MED ORDER — ENOXAPARIN SODIUM 40 MG/0.4ML ~~LOC~~ SOLN
40.0000 mg | SUBCUTANEOUS | Status: DC
  Administered 2016-09-15 – 2016-09-16 (×2): 40 mg via SUBCUTANEOUS
  Filled 2016-09-14 (×2): qty 0.4

## 2016-09-14 MED ORDER — ACETAMINOPHEN 325 MG PO TABS
650.0000 mg | ORAL_TABLET | Freq: Once | ORAL | Status: AC
  Administered 2016-09-14: 650 mg via ORAL
  Filled 2016-09-14: qty 2

## 2016-09-14 MED ORDER — ONDANSETRON HCL 4 MG PO TABS: 4.0000 mg | ORAL_TABLET | Freq: Four times a day (QID) | ORAL | Status: DC | PRN

## 2016-09-14 MED ORDER — CEFTRIAXONE SODIUM-DEXTROSE 1-3.74 GM-% IV SOLR
1.0000 g | Freq: Once | INTRAVENOUS | Status: AC
  Administered 2016-09-14: 1 g via INTRAVENOUS
  Filled 2016-09-14: qty 50

## 2016-09-14 MED ORDER — IPRATROPIUM-ALBUTEROL 0.5-2.5 (3) MG/3ML IN SOLN
3.0000 mL | RESPIRATORY_TRACT | Status: DC | PRN
  Administered 2016-09-15 – 2016-09-16 (×2): 3 mL via RESPIRATORY_TRACT
  Filled 2016-09-14 (×2): qty 3

## 2016-09-14 MED ORDER — CEFTRIAXONE SODIUM-DEXTROSE 1-3.74 GM-% IV SOLR
1.0000 g | INTRAVENOUS | Status: DC
  Administered 2016-09-15 – 2016-09-16 (×2): 1 g via INTRAVENOUS
  Filled 2016-09-14 (×4): qty 50

## 2016-09-14 MED ORDER — BENZONATATE 100 MG PO CAPS: 200.0000 mg | ORAL_CAPSULE | Freq: Three times a day (TID) | ORAL | Status: DC | PRN

## 2016-09-14 MED ORDER — DEXTROSE 5 % IV SOLN: 1.0000 g | Freq: Once | INTRAVENOUS | Status: DC

## 2016-09-14 MED ORDER — ACETAMINOPHEN 650 MG RE SUPP: 650.0000 mg | Freq: Four times a day (QID) | RECTAL | Status: DC | PRN

## 2016-09-14 MED ORDER — GUAIFENESIN-DM 100-10 MG/5ML PO SYRP: 5.0000 mL | ORAL_SOLUTION | ORAL | Status: DC | PRN

## 2016-09-14 MED ORDER — ACETAMINOPHEN 325 MG PO TABS
650.0000 mg | ORAL_TABLET | Freq: Four times a day (QID) | ORAL | Status: DC | PRN
  Administered 2016-09-15 – 2016-09-16 (×3): 650 mg via ORAL
  Filled 2016-09-14 (×3): qty 2

## 2016-09-14 NOTE — ED Triage Notes (Signed)
Patient to ED via POV for chest pain and shortness of breath. Patient states that her symptoms started 1 week ago. Patient O2 sat in triage 87% on room air. Patient placed on 2 Liter of O2 via  with improvement in O2 sat to 91% , O2 increased to 3 Liters with improvement in sats to 97% Patient noted to have increased work of breathing. Patient has wheezing through out all lung fields.

## 2016-09-14 NOTE — H&P (Signed)
[facility] - [place] at [facility]   PATIENT NAME: Diana Meadows    MR#:  [id number]  DATE OF BIRTH:  1919/04/30  DATE OF ADMISSION:  09/14/2016  PRIMARY CARE PHYSICIAN: Lauro Regulus., MD   REQUESTING/REFERRING PHYSICIAN: Huel Cote, MD  CHIEF COMPLAINT:   Chief Complaint  Patient presents with  . Chest Pain  . Shortness of Breath    HISTORY OF PRESENT ILLNESS:  Diana Meadows  is a 83 y.o. female who presents with 1 week of progressive shortness of breath and increasing cough. Patient has a history of COPD on the chart, but states that she was surprised when she saw this diagnosis as she takes no home inhalers and has not been informed prior to this that she had COPD. On presentation here to the ED she was febrile, mildly hypoxic requiring oxygen. Despite the lack of an elevated white blood cell count and no focal pneumonia on x-ray, her clinical picture best fits with pneumonia hospitalists were called for admission.  PAST MEDICAL HISTORY:   Past Medical History:  Diagnosis Date  . COPD (chronic obstructive pulmonary disease) (HCC)   . Depression   . GERD (gastroesophageal reflux disease)     PAST SURGICAL HISTORY:   Past Surgical History:  Procedure Laterality Date  . ABDOMINAL HYSTERECTOMY    . CESAREAN SECTION     x 3  . CHOLECYSTECTOMY    . HERNIA REPAIR      SOCIAL HISTORY:   Social History  Substance Use Topics  . Smoking status: Former Games developer  . Smokeless tobacco: Never Used  . Alcohol use Yes     Comment: occ    FAMILY HISTORY:   Family History  Problem Relation Age of Onset  . Lymphoma Mother   . Tuberculosis Father     DRUG ALLERGIES:   Allergies  Allergen Reactions  . Codeine   . Nsaids     MEDICATIONS AT HOME:   Prior to Admission medications   Medication Sig Start Date End Date Taking? Authorizing Provider  acetaminophen (TYLENOL) 500 MG tablet Take 1,000 mg by mouth 2 (two) times daily.   Yes  Historical Provider, MD  estradiol (ESTRACE) 0.5 MG tablet Take 0.5 mg by mouth daily. 08/18/16  Yes Historical Provider, MD  guaifenesin (ROBITUSSIN) 100 MG/5ML syrup Take 200 mg by mouth 3 (three) times daily as needed for cough.   Yes Historical Provider, MD    REVIEW OF SYSTEMS:  Review of Systems  Constitutional: Negative for chills, fever, malaise/fatigue and weight loss.  HENT: Negative for ear pain, hearing loss and tinnitus.   Eyes: Negative for blurred vision, double vision, pain and redness.  Respiratory: Positive for cough, sputum production, shortness of breath and wheezing. Negative for hemoptysis.   Cardiovascular: Negative for chest pain, palpitations, orthopnea and leg swelling.  Gastrointestinal: Negative for abdominal pain, constipation, diarrhea, nausea and vomiting.  Genitourinary: Negative for dysuria, frequency and hematuria.  Musculoskeletal: Negative for back pain, joint pain and neck pain.  Skin:       No acne, rash, or lesions  Neurological: Negative for dizziness, tremors, focal weakness and weakness.  Endo/Heme/Allergies: Negative for polydipsia. Does not bruise/bleed easily.  Psychiatric/Behavioral: Negative for depression. The patient is not nervous/anxious and does not have insomnia.      VITAL SIGNS:   Vitals:   09/14/16 1821 09/14/16 1834 09/14/16 1900 09/14/16 1930  BP:  (!) 150/72 (!) 137/55 123/70  Pulse:  86 89 91  Resp:  Marland Kitchen)  26 (!) 28 20  Temp: 98 F (36.7 C)     TempSrc: Oral     SpO2:  98% 96% 99%  Weight:      Height:       Wt Readings from Last 3 Encounters:  09/14/16 68 kg (150 lb)    PHYSICAL EXAMINATION:  Physical Exam  Vitals reviewed. Constitutional: She is oriented to person, place, and time. She appears well-developed and well-nourished. No distress.  HENT:  Head: Normocephalic and atraumatic.  Mouth/Throat: Oropharynx is clear and moist.  Eyes: Conjunctivae and EOM are normal. Pupils are equal, round, and reactive to  light. No scleral icterus.  Neck: Normal range of motion. Neck supple. No JVD present. No thyromegaly present.  Cardiovascular: Normal rate, regular rhythm and intact distal pulses.  Exam reveals no gallop and no friction rub.   No murmur heard. Respiratory: Effort normal. No respiratory distress. She has wheezes. She has no rales.  Patchy coarse breath sounds bilaterally  GI: Soft. Bowel sounds are normal. She exhibits no distension. There is no tenderness.  Musculoskeletal: Normal range of motion. She exhibits no edema.  No arthritis, no gout  Lymphadenopathy:    She has no cervical adenopathy.  Neurological: She is alert and oriented to person, place, and time. No cranial nerve deficit.  No dysarthria, no aphasia  Skin: Skin is warm and dry. No rash noted. No erythema.  Psychiatric: She has a normal mood and affect. Her behavior is normal. Judgment and thought content normal.    LABORATORY PANEL:   CBC  Recent Labs Lab 09/14/16 1647  WBC 4.9  HGB 15.0  HCT 45.8  PLT 182   ------------------------------------------------------------------------------------------------------------------  Chemistries   Recent Labs Lab 09/14/16 1647  NA 139  K 4.0  CL 101  CO2 32  GLUCOSE 129*  BUN 16  CREATININE 0.87  CALCIUM 8.7*  AST 28  ALT 14  ALKPHOS 88  BILITOT 0.3   ------------------------------------------------------------------------------------------------------------------  Cardiac Enzymes No results for input(s): TROPONINI in the last 168 hours. ------------------------------------------------------------------------------------------------------------------  RADIOLOGY:  Dg Chest 2 View  Result Date: 09/14/2016 CLINICAL DATA:  Chest pain and shortness of breath beginning 1 week ago, 87% saturation on room air, history COPD EXAM: CHEST  2 VIEW COMPARISON:  10/03/2013 FINDINGS: Normal heart size, mediastinal contours, and pulmonary vascularity. Atherosclerotic  calcification aorta. Lungs hyperinflated with minimal central peribronchial thickening. No pulmonary infiltrate, pleural effusion, or pneumothorax. Bones demineralized. IMPRESSION: Peribronchial thickening and hyperinflation likely reflecting COPD. No acute infiltrate. Aortic atherosclerosis. Electronically Signed   By: Ulyses Southward M.D.   On: 09/14/2016 15:33    EKG:   Orders placed or performed during the hospital encounter of 09/14/16  . EKG 12-Lead  . EKG 12-Lead  . ED EKG  . ED EKG    IMPRESSION AND PLAN:  Principal Problem:   CAP (community acquired pneumonia) - IV antibiotics started in the ED and continued on admission. Blood cultures sent from ED. When necessary antitussives and when necessary duo nebs for symptom control Active Problems:   COPD (chronic obstructive pulmonary disease) (HCC) - unclear history to this diagnosis, patient certainly is not on maintenance medications and has not ever had COPD flareups before. We will use duo nebs for symptom control as well as other treatments as above.   GERD (gastroesophageal reflux disease) - not on home meds for this, we'll treat when necessary  All the records are reviewed and case discussed with ED provider. Management plans discussed with the patient  and/or family.  DVT PROPHYLAXIS: SubQ lovenox  GI PROPHYLAXIS: None  ADMISSION STATUS: Inpatient  CODE STATUS: Full Code Status History    This patient does not have a recorded code status. Please follow your organizational policy for patients in this situation.    Advance Directive Documentation   Flowsheet Row Most Recent Value  Type of Advance Directive  Healthcare Power of Attorney  Pre-existing out of facility DNR order (yellow form or pink MOST form)  No data  "MOST" Form in Place?  No data      TOTAL TIME TAKING CARE OF THIS PATIENT: 45 minutes.    Myanna Ziesmer FIELDING 09/14/2016, 8:38 PM  [facility]  Office  [phone]  CC: Primary  care physician; Lauro RegulusANDERSON,MARSHALL W., MD

## 2016-09-14 NOTE — ED Notes (Signed)
Pt states since last week she has had a nonproductive cough. Pt also states when she developed the cough she developed non radiating chest pain. At this time pt denies chest pain.

## 2016-09-14 NOTE — ED Provider Notes (Signed)
Time Seen: Approximately *1606  I have reviewed the triage notes  Chief Complaint: Chest Pain and Shortness of Breath   History of Present Illness: Diana Meadows is a 83 y.o. female who has a history of COPD. Patient has had some increased chest discomfort and shortness of breath which started approximately 5 days prior to arrival. Pulse ox here was 87% on room air and she was placed on a 3 L nasal cannula. She feels short of breath but has had no chest pain currently and denies any productive cough at home. She denies any arm job back or flank pain she has descriptions of shaking and chills and did not have a known fever prior to arrival. Patient's history and review of systems is taken through the patient along with a family member.   Past Medical History:  Diagnosis Date  . COPD (chronic obstructive pulmonary disease) (HCC)     There are no active problems to display for this patient.   Past Surgical History:  Procedure Laterality Date  . ABDOMINAL HYSTERECTOMY    . CESAREAN SECTION     x 3  . CHOLECYSTECTOMY    . HERNIA REPAIR      Past Surgical History:  Procedure Laterality Date  . ABDOMINAL HYSTERECTOMY    . CESAREAN SECTION     x 3  . CHOLECYSTECTOMY    . HERNIA REPAIR      Current Outpatient Rx  . Order #: [id number] Class: Historical Med  . Order #: [id number] Class: Historical Med  . Order #: [id number] Class: Historical Med    Allergies:  Codeine and Nsaids  Family History: No family history on file.  Social History: Social History  Substance Use Topics  . Smoking status: Former Games developermoker  . Smokeless tobacco: Never Used  . Alcohol use Yes     Comment: occ     Review of Systems:   10 point review of systems was performed and was otherwise negative:  Constitutional: No Known fever Eyes: No visual disturbances ENT: No sore throat, ear pain Cardiac: No current chest pain Respiratory: Shortness of breath without wheezing, or stridor Abdomen: No  abdominal pain, no vomiting, No diarrhea Endocrine: No weight loss, No night sweats Extremities: No peripheral edema, cyanosis. No history of blood clots in the lungs and no current risk factors Skin: No rashes, easy bruising Neurologic: No focal weakness, trouble with speech or swollowing Urologic: No dysuria, Hematuria, or urinary frequency   Physical Exam:  ED Triage Vitals  Enc Vitals Group     BP 09/14/16 1419 (!) 159/72     Pulse Rate 09/14/16 1419 87     Resp 09/14/16 1419 (!) 22     Temp 09/14/16 1419 98.1 F (36.7 C)     Temp Source 09/14/16 1419 Oral     SpO2 09/14/16 1419 (!) 87 %     Weight 09/14/16 1419 150 lb (68 kg)     Height 09/14/16 1419 5' 2.5" (1.588 m)     Head Circumference --      Peak Flow --      Pain Score 09/14/16 1443 7     Pain Loc --      Pain Edu? --      Excl. in GC? --     General: Awake , Alert , and Oriented times 3; GCS 15 No signs of respiratory distress Head: Normal cephalic , atraumatic Eyes: Pupils equal , round, reactive to light Nose/Throat: No nasal drainage, patent upper airway  without erythema or exudate.  Neck: Supple, Full range of motion, No anterior adenopathy or palpable thyroid masses Lungs: Mild rhonchi heard bilaterally at the bases without any significant wheezes or rales  Heart: Regular rate, regular rhythm without murmurs , gallops , or rubs Abdomen: Soft, non tender without rebound, guarding , or rigidity; bowel sounds positive and symmetric in all 4 quadrants. No organomegaly .        Extremities: 2 plus symmetric pulses. No edema, clubbing or cyanosis Neurologic: normal ambulation, Motor symmetric without deficits, sensory intact Skin: warm, dry, no rashes   Labs:   All laboratory work was reviewed including any pertinent negatives or positives listed below:  Labs Reviewed  CBC WITH DIFFERENTIAL/PLATELET - Abnormal; Notable for the following:       Result Value   Lymphs Abs 0.8 (*)    Basophils Absolute 0.4  (*)    All other components within normal limits  COMPREHENSIVE METABOLIC PANEL - Abnormal; Notable for the following:    Glucose, Bld 129 (*)    Calcium 8.7 (*)    GFR calc non Af Amer 55 (*)    All other components within normal limits  RAPID INFLUENZA A&B ANTIGENS ([facility] ONLY)  CULTURE, BLOOD (ROUTINE X 2)  CULTURE, BLOOD (ROUTINE X 2)  BRAIN NATRIURETIC PEPTIDE    EKG:  ED ECG REPORT I, Jennye Moccasin, the attending physician, personally viewed and interpreted this ECG.  Date: 09/14/2016 EKG Time: 1430 Rate: 95 Rhythm: normal sinus rhythm QRS Axis: normal Intervals: normal ST/T Wave abnormalities: normal Conduction Disturbances: none Narrative Interpretation: unremarkable Normal EKG   Radiology: "Dg Chest 2 View  Result Date: 09/14/2016 CLINICAL DATA:  Chest pain and shortness of breath beginning 1 week ago, 87% saturation on room air, history COPD EXAM: CHEST  2 VIEW COMPARISON:  10/03/2013 FINDINGS: Normal heart size, mediastinal contours, and pulmonary vascularity. Atherosclerotic calcification aorta. Lungs hyperinflated with minimal central peribronchial thickening. No pulmonary infiltrate, pleural effusion, or pneumothorax. Bones demineralized. IMPRESSION: Peribronchial thickening and hyperinflation likely reflecting COPD. No acute infiltrate. Aortic atherosclerosis. Electronically Signed   By: Ulyses Southward M.D.   On: 09/14/2016 15:33  "  I personally reviewed the radiologic studies    ED Course: * Patient's stay here was uneventful and blood cultures 2 were obtained first and pneumonia. Her chest x-ray did show some peribronchial thickening and hyperinflation and influenza test is negative. The patient is mildly hypoxic likely from her COPD. Patient was given a DuoNeb I did not start her on steroids due to immunosuppression. Patient was started on IV Rocephin for suspected pneumonia. Lactic acid level was pending but there was no evidence of patient being septic with  her fever as her blood pressure, heart rate, etc. all were within normal limits. She was not placed in the septic protocol and I felt did not require extensive IV fluid resuscitation Clinical Course      Assessment: * Acute febrile illness Mild hypoxia History of COPD     Plan:  Inpatient            Jennye Moccasin, MD 09/14/16 1859

## 2016-09-14 NOTE — ED Notes (Signed)
CBC collected L AC, sent to lab.

## 2016-09-14 NOTE — Progress Notes (Signed)
Pharmacy Antibiotic Note  Darel Hongearl V Stockard is a 83 y.o. female admitted on 09/14/2016 with CAP.  Pharmacy has been consulted for ceftriaxone dosing.  Plan: Ceftriaxone 1 gm IV Q24H  Height: 5' 2.5" (158.8 cm) Weight: 150 lb (68 kg) IBW/kg (Calculated) : 51.25  Temp (24hrs), Avg:99.1 F (37.3 C), Min:98 F (36.7 C), Max:101.1 F (38.4 C)   Recent Labs Lab 09/14/16 1647 09/14/16 1847  WBC 4.9  --   CREATININE 0.87  --   LATICACIDVEN  --  1.5    Estimated Creatinine Clearance: 35.4 mL/min (by C-G formula based on SCr of 0.87 mg/dL).    Allergies  Allergen Reactions  . Codeine   . Nsaids    Thank you for allowing pharmacy to be a part of this patient's care.  Carola FrostNathan A Anagabriela Jokerst, Pharm.D., BCPS Clinical Pharmacist 09/14/2016 10:21 PM

## 2016-09-15 LAB — BASIC METABOLIC PANEL
Anion gap: 6 (ref 5–15)
BUN: 15 mg/dL (ref 6–20)
CHLORIDE: 101 mmol/L (ref 101–111)
CO2: 31 mmol/L (ref 22–32)
Calcium: 8.1 mg/dL — ABNORMAL LOW (ref 8.9–10.3)
Creatinine, Ser: 0.75 mg/dL (ref 0.44–1.00)
GFR calc Af Amer: >60 mL/min (ref >60)
GFR calc non Af Amer: >60 mL/min (ref >60)
GLUCOSE: 99 mg/dL (ref 65–99)
POTASSIUM: 4.3 mmol/L (ref 3.5–5.1)
Sodium: 138 mmol/L (ref 135–145)

## 2016-09-15 LAB — CBC
HEMATOCRIT: 41.8 % (ref 35.0–47.0)
Hemoglobin: 13.9 g/dL (ref 12.0–16.0)
MCH: 31.6 pg (ref 26.0–34.0)
MCHC: 33.3 g/dL (ref 32.0–36.0)
MCV: 95 fL (ref 80.0–100.0)
Platelets: 158 10*3/uL (ref 150–440)
RBC: 4.4 MIL/uL (ref 3.80–5.20)
RDW: 13.2 % (ref 11.5–14.5)
WBC: 4.9 10*3/uL (ref 3.6–11.0)

## 2016-09-15 NOTE — Progress Notes (Signed)
[facility] - [place] [facility]   PATIENT NAME: Diana Meadows    MR#:  [id number]  DATE OF BIRTH:  1919/03/01  SUBJECTIVE:  CHIEF COMPLAINT:   Chief Complaint  Patient presents with  . Chest Pain  . Shortness of Breath    Came with shortness of breath, found to have pneumonia. Still having some wheezing and requiring supplemental oxygen.  REVIEW OF SYSTEMS:  CONSTITUTIONAL: No fever, fatigue or weakness.  EYES: No blurred or double vision.  EARS, NOSE, AND THROAT: No tinnitus or ear pain.  RESPIRATORY: positive for cough, shortness of breath, wheezing ,no hemoptysis.  CARDIOVASCULAR: No chest pain, orthopnea, edema.  GASTROINTESTINAL: No nausea, vomiting, diarrhea or abdominal pain.  GENITOURINARY: No dysuria, hematuria.  ENDOCRINE: No polyuria, nocturia,  HEMATOLOGY: No anemia, easy bruising or bleeding SKIN: No rash or lesion. MUSCULOSKELETAL: No joint pain or arthritis.   NEUROLOGIC: No tingling, numbness, weakness.  PSYCHIATRY: No anxiety or depression.   ROS  DRUG ALLERGIES:   Allergies  Allergen Reactions  . Codeine   . Nsaids     VITALS:  Blood pressure (!) 128/56, pulse 79, temperature 98.5 F (36.9 C), temperature source Oral, resp. rate 18, height 5' 2.5" (1.588 m), weight 69.9 kg (154 lb 1.6 oz), SpO2 94 %.  PHYSICAL EXAMINATION:  GENERAL:  83 y.o.-year-old patient lying in the bed with no acute distress.  EYES: Pupils equal, round, reactive to light and accommodation. No scleral icterus. Extraocular muscles intact.  HEENT: Head atraumatic, normocephalic. Oropharynx and nasopharynx clear.  NECK:  Supple, no jugular venous distention. No thyroid enlargement, no tenderness.  LUNGS: Normal breath sounds bilaterally, some wheezing, some crepitation. No use of accessory muscles of respiration.  CARDIOVASCULAR: S1, S2 normal. No murmurs, rubs, or gallops.  ABDOMEN: Soft, nontender, nondistended. Bowel sounds present. No organomegaly or  mass.  EXTREMITIES: No pedal edema, cyanosis, or clubbing.  NEUROLOGIC: Cranial nerves II through XII are intact. Muscle strength 5/5 in all extremities. Sensation intact. Gait not checked.  PSYCHIATRIC: The patient is alert and oriented x 3.  SKIN: No obvious rash, lesion, or ulcer.   Physical Exam LABORATORY PANEL:   CBC  Recent Labs Lab 09/15/16 0253  WBC 4.9  HGB 13.9  HCT 41.8  PLT 158   ------------------------------------------------------------------------------------------------------------------  Chemistries   Recent Labs Lab 09/14/16 1647 09/15/16 0253  NA 139 138  K 4.0 4.3  CL 101 101  CO2 32 31  GLUCOSE 129* 99  BUN 16 15  CREATININE 0.87 0.75  CALCIUM 8.7* 8.1*  AST 28  --   ALT 14  --   ALKPHOS 88  --   BILITOT 0.3  --    ------------------------------------------------------------------------------------------------------------------  Cardiac Enzymes No results for input(s): TROPONINI in the last 168 hours. ------------------------------------------------------------------------------------------------------------------  RADIOLOGY:  Dg Chest 2 View  Result Date: 09/14/2016 CLINICAL DATA:  Chest pain and shortness of breath beginning 1 week ago, 87% saturation on room air, history COPD EXAM: CHEST  2 VIEW COMPARISON:  10/03/2013 FINDINGS: Normal heart size, mediastinal contours, and pulmonary vascularity. Atherosclerotic calcification aorta. Lungs hyperinflated with minimal central peribronchial thickening. No pulmonary infiltrate, pleural effusion, or pneumothorax. Bones demineralized. IMPRESSION: Peribronchial thickening and hyperinflation likely reflecting COPD. No acute infiltrate. Aortic atherosclerosis. Electronically Signed   By: Ulyses SouthwardMark  Boles M.D.   On: 09/14/2016 15:33    ASSESSMENT AND PLAN:   Principal Problem:   CAP (community acquired pneumonia) Active Problems:   COPD (chronic obstructive pulmonary disease) (HCC)   GERD  (  gastroesophageal reflux disease)   Depression  * CAP (community acquired pneumonia) - IV antibiotics   Blood cultures sent from ED. When necessary antitussives and when necessary duo nebs for symptom control  *  COPD (chronic obstructive pulmonary disease) (HCC) - unclear history to this diagnosis, patient certainly is not on maintenance medications and has not ever had COPD flareups before. We will use duo nebs for symptom control as well as other treatments as above.  as per daughter for last few months patient has decreased functional ability( she cannot swim now)   *  GERD (gastroesophageal reflux disease) - not on home meds for this, we'll treat when necessary.    All the records are reviewed and case discussed with Care Management/Social Workerr. Management plans discussed with the patient, family and they are in agreement.  CODE STATUS: full.  TOTAL TIME TAKING CARE OF THIS PATIENT: 35 minutes.     POSSIBLE D/C IN 1-2 DAYS, DEPENDING ON CLINICAL CONDITION.   Altamese Dilling M.D on 09/15/2016   Between 7am to 6pm - Pager - [phone]  After 6pm go to [email or web address] - password [email or web address]  [facility] [facility]  Office  [phone]  CC: Primary care physician; Lauro Regulus., MD  Note: This dictation was prepared with Dragon dictation along with smaller phrase technology. Any transcriptional errors that result from this process are unintentional.

## 2016-09-15 NOTE — Progress Notes (Signed)
Patient is A&O x4. HOH. Up with assist x1. Good appetite. Weaned O2 to 1L, 92-96%. SOB with activity. Tele in place. Takes meds whole without difficultly.

## 2016-09-16 NOTE — Progress Notes (Signed)
Patient is A&O x4, HOH. Up with SBA. Walked in hallway x2 with nursing, needs O2 at 3L with activity. Steady gait. 2L at rest. Updated daughter. Daughter would like patient to go to SNF for 3 weeks. Explained to her that patient doesn't have a skilled need, but that the message will be passed on. Notified case worker.

## 2016-09-16 NOTE — Care Management Note (Signed)
Case Management Note  Patient Details  Name: Diana Meadows MRN: [id number] Date of Birth: 18-Dec-1918  Subjective/Objective:        Daughter called to report that she is sick at home with the flu and does not want Diana Meadows coming home into that. Dr Elisabeth PigeonVachhani was updated.             Action/Plan:   Expected Discharge Date:  09/16/16               Expected Discharge Plan:     In-House Referral:     Discharge planning Services     Post Acute Care Choice:    Choice offered to:     DME Arranged:    DME Agency:     HH Arranged:    HH Agency:     Status of Service:     If discussed at [employer] Meetings, dates discussed:    Additional Comments:  Shaylea Ucci A, RN 09/16/2016, 1:13 PM

## 2016-09-16 NOTE — Progress Notes (Signed)
SATURATION QUALIFICATIONS: (This note is used to comply with regulatory documentation for home oxygen)  Patient Saturations on Room Air at Rest = 87%  Patient Saturations on Room Air while Ambulating = 80%  Patient Saturations on 3 Liters of oxygen while Ambulating = 93%  Please briefly explain why patient needs home oxygen: Becomes short of breath with activity and even at rest can not maintain sats above 90%

## 2016-09-16 NOTE — Progress Notes (Signed)
[facility] -  at [facility]   PATIENT NAME: Diana Meadows    MR#:  [id number]  DATE OF BIRTH:  1919/04/11  SUBJECTIVE:  CHIEF COMPLAINT:   Chief Complaint  Patient presents with  . Chest Pain  . Shortness of Breath    Came with shortness of breath, found to have pneumonia.  having some wheezing and requiring supplemental oxygen.  Patient was a smoker long time ago, and because of old age she may have some underlying lung issues so she will qualify for home oxygen for now. I spoke to patient and her family about her going home today, but her daughter is having severe respiratory symptoms and seek today, so she is requesting to keep patient one more day in hospital.  REVIEW OF SYSTEMS:  CONSTITUTIONAL: No fever, fatigue or weakness.  EYES: No blurred or double vision.  EARS, NOSE, AND THROAT: No tinnitus or ear pain.  RESPIRATORY: positive for cough, shortness of breath, wheezing ,no hemoptysis.  CARDIOVASCULAR: No chest pain, orthopnea, edema.  GASTROINTESTINAL: No nausea, vomiting, diarrhea or abdominal pain.  GENITOURINARY: No dysuria, hematuria.  ENDOCRINE: No polyuria, nocturia,  HEMATOLOGY: No anemia, easy bruising or bleeding SKIN: No rash or lesion. MUSCULOSKELETAL: No joint pain or arthritis.   NEUROLOGIC: No tingling, numbness, weakness.  PSYCHIATRY: No anxiety or depression.   ROS  DRUG ALLERGIES:   Allergies  Allergen Reactions  . Codeine   . Nsaids     VITALS:  Blood pressure (!) 108/41, pulse 73, temperature 98.6 F (37 C), temperature source Oral, resp. rate 18, height 5' 2.5" (1.588 m), weight 69.9 kg (154 lb 1.6 oz), SpO2 98 %.  PHYSICAL EXAMINATION:  GENERAL:  83 y.o.-year-old patient lying in the bed with no acute distress.  EYES: Pupils equal, round, reactive to light and accommodation. No scleral icterus. Extraocular muscles intact.  HEENT: Head atraumatic, normocephalic. Oropharynx and nasopharynx clear.  NECK:  Supple, no  jugular venous distention. No thyroid enlargement, no tenderness.  LUNGS: Normal breath sounds bilaterally, some wheezing, some crepitation. No use of accessory muscles of respiration.  CARDIOVASCULAR: S1, S2 normal. No murmurs, rubs, or gallops.  ABDOMEN: Soft, nontender, nondistended. Bowel sounds present. No organomegaly or mass.  EXTREMITIES: No pedal edema, cyanosis, or clubbing.  NEUROLOGIC: Cranial nerves II through XII are intact. Muscle strength 5/5 in all extremities. Sensation intact. Gait not checked.  PSYCHIATRIC: The patient is alert and oriented x 3.  SKIN: No obvious rash, lesion, or ulcer.   Physical Exam LABORATORY PANEL:   CBC  Recent Labs Lab 09/15/16 0253  WBC 4.9  HGB 13.9  HCT 41.8  PLT 158   ------------------------------------------------------------------------------------------------------------------  Chemistries   Recent Labs Lab 09/14/16 1647 09/15/16 0253  NA 139 138  K 4.0 4.3  CL 101 101  CO2 32 31  GLUCOSE 129* 99  BUN 16 15  CREATININE 0.87 0.75  CALCIUM 8.7* 8.1*  AST 28  --   ALT 14  --   ALKPHOS 88  --   BILITOT 0.3  --    ------------------------------------------------------------------------------------------------------------------  Cardiac Enzymes No results for input(s): TROPONINI in the last 168 hours. ------------------------------------------------------------------------------------------------------------------  RADIOLOGY:  No results found.  ASSESSMENT AND PLAN:   Principal Problem:   CAP (community acquired pneumonia) Active Problems:   COPD (chronic obstructive pulmonary disease) (HCC)   GERD (gastroesophageal reflux disease)   Depression  * CAP (community acquired pneumonia) bronchitis - IV antibiotics   Blood cultures sent from ED. When necessary antitussives  and when necessary duo nebs for symptom control  no fever.  *  COPD (chronic obstructive pulmonary disease) (HCC) - unclear history to this  diagnosis, patient certainly is not on maintenance medications and has not ever had COPD flareups before. We will use duo nebs for symptom control as well as other treatments as above.  as per daughter for last few months patient has decreased functional ability( she cannot swim now)   she was a smoker for long time ago, she may have underlying undiagnosed COPD.  Most likely may need supplemental oxygen.  *  GERD (gastroesophageal reflux disease) - not on home meds for this, we'll treat when necessary.    All the records are reviewed and case discussed with Care Management/Social Workerr. Management plans discussed with the patient, family and they are in agreement.  CODE STATUS: full.  TOTAL TIME TAKING CARE OF THIS PATIENT: 35 minutes.     POSSIBLE D/C IN 1-2 DAYS, DEPENDING ON CLINICAL CONDITION.   Altamese DillingVACHHANI, Pau Banh M.D on 09/16/2016   Between 7am to 6pm - Pager - [phone]  After 6pm go to [email or web address] - password [employer] [facility]  [facility] [place] [facility]  Office  [phone]  CC: Primary care physician; Lauro RegulusANDERSON,MARSHALL W., MD  Note: This dictation was prepared with Dragon dictation along with smaller phrase technology. Any transcriptional errors that result from this process are unintentional.

## 2016-09-17 MED ORDER — AZITHROMYCIN 250 MG PO TABS: 250.0000 mg | ORAL_TABLET | Freq: Every day | ORAL | 0 refills | Status: AC

## 2016-09-17 MED ORDER — GUAIFENESIN-DM 100-10 MG/5ML PO SYRP: 5.0000 mL | ORAL_SOLUTION | ORAL | 0 refills | Status: DC | PRN

## 2016-09-17 MED ORDER — FLUTICASONE-SALMETEROL 100-50 MCG/DOSE IN AEPB: 1.0000 | INHALATION_SPRAY | Freq: Two times a day (BID) | RESPIRATORY_TRACT | 2 refills | Status: DC

## 2016-09-17 MED ORDER — CEFUROXIME AXETIL 250 MG PO TABS: 250.0000 mg | ORAL_TABLET | Freq: Two times a day (BID) | ORAL | 0 refills | Status: AC

## 2016-09-17 MED ORDER — BENZONATATE 200 MG PO CAPS: 200.0000 mg | ORAL_CAPSULE | Freq: Three times a day (TID) | ORAL | 0 refills | Status: DC | PRN

## 2016-09-17 NOTE — Progress Notes (Signed)
SATURATION QUALIFICATIONS: (This note is used to comply with regulatory documentation for home oxygen)  Patient Saturations on Room Air at Rest = 84%  Patient Saturations on Room Air while Ambulating =83 %  Patient Saturations on *2** Liters of oxygen while Ambulating =94%  Please briefly explain why patient needs home oxygen:pt desaturates with activity.

## 2016-09-17 NOTE — Plan of Care (Signed)
Problem: Phase II Progression Outcomes Goal: O2 sats > equal to 90% on RA or at baseline Outcome: Adequate for Discharge Pt qualified for home O2.

## 2016-09-17 NOTE — Progress Notes (Signed)
Shift assessment completed at 0800. Pt alert and oriented, o2 in place. This was removed and pt ra sat at rest was 94%. Lungs are decreased to bilat bases, pt stated she finds her breathing to labored sometimes. Hr is regular, abdomen is soft, bs heard. Pt is oob to bathroom to void prn, ppp, no edema noted. PIV #20 intact to lac, site free of redness and swelling. After pt ate breakfast, sat was checked with pt sitting on edge of bed, was 84%. Pt in no distress, aO2 replaced at [facility], sats increased into 90's. Pt did state she had a productive cough. Pt was evaluated by physical therapy, and qualified for home o2. Pt was dc'd with home o2 this afternoon, had steady gait with ambulation.

## 2016-09-17 NOTE — Care Management (Signed)
Attempted to reach daughter Andrey CampanileSandy without success at home and mobile number. No family in room.

## 2016-09-17 NOTE — Progress Notes (Signed)
Discharge instructions and prescriptions given to patient and her daughter. Patient's daughter verbalized understanding due to patient having trouble hearing with her hearing aides. Patient stated she had both her hearing aides and belongings.

## 2016-09-17 NOTE — Progress Notes (Signed)
CH rounding the unit visit the Pt. Pt was lying in the bed with a family member at bedside. CH spoke with the family member, who told CH that the Pt was about to be discharged. CH wish the Pt a quick recovery and a safe ride home.     09/17/16 1400  Clinical Encounter Type  Visited With Patient;Patient and family together  Visit Type Initial  Referral From Chaplain  Spiritual Encounters  Spiritual Needs Prayer;Other (Comment)

## 2016-09-17 NOTE — Evaluation (Signed)
Physical Therapy Evaluation Patient Details Name: Diana Meadows MRN: [id number] DOB: Dec 29, 1918 Today's Date: 09/17/2016   History of Present Illness  Pt is a 83 y/o F who presented with 1 wk of progressive SOB and increasing cough.  She has required supplemental oxygen while in hospital but was not using O2 at baseline.  Pt's PMH includes COPD.      Clinical Impression  Pt admitted with above diagnosis. Pt currently with functional limitations due to the deficits listed below (see PT Problem List).  Diana Meadows presents with no balance impairments. She is at a supervision level for safety with transfers and ambulation.   SpO2 down to 88% on RA while ambulating and requires 2L O2 via [place] to return up to mid 90s.  Recommending Pulmonary Rehab at d/c.    Follow Up Recommendations No PT follow up    Equipment Recommendations  None recommended by PT    Recommendations for Other Services Other (comment) (Pulmonary Rehab, RN notified of this recommendation)     Precautions / Restrictions Precautions Precautions: Other (comment) Precaution Comments: monitor O2 Restrictions Weight Bearing Restrictions: No      Mobility  Bed Mobility               General bed mobility comments: Pt sitting in chair upon PT arrival  Transfers Overall transfer level: Needs assistance Equipment used: None Transfers: Sit to/from Stand Sit to Stand: Supervision         General transfer comment: No instability noted, pt with safe technique.  Supervision for safety.  Ambulation/Gait Ambulation/Gait assistance: Supervision Ambulation Distance (Feet): 215 Feet Assistive device: None Gait Pattern/deviations: Step-through pattern;Decreased stride length Gait velocity: decreased Gait velocity interpretation: Below normal speed for age/gender General Gait Details: Dec gait speed but steady and no instability noted.  Supervision for safety.  SpO2 down to 88% on RA and requires 2L O2 via [place] to return  up to mid 90s.  Stairs            Wheelchair Mobility    Modified Rankin (Stroke Patients Only)       Balance Overall balance assessment: No apparent balance deficits (not formally assessed)                           High level balance activites: Direction changes;Turns;Sudden stops;Head turns High Level Balance Comments: No instability with higher level balance activities documented above.             Pertinent Vitals/Pain Pain Assessment: No/denies pain    Home Living Family/patient expects to be discharged to:: Private residence Living Arrangements: Children (daughter) Available Help at Discharge: Family;Available 24 hours/day Type of Home: House Home Access: Level entry     Home Layout: Two level;Able to live on main level with bedroom/bathroom Home Equipment: None      Prior Function Level of Independence: Independent         Comments: Was not using O2 PTA and denies any falls in the past 6 months.  Pt Ind with all mobility.     Hand Dominance        Extremity/Trunk Assessment   Upper Extremity Assessment Upper Extremity Assessment: Overall WFL for tasks assessed    Lower Extremity Assessment Lower Extremity Assessment: Overall WFL for tasks assessed    Cervical / Trunk Assessment Cervical / Trunk Assessment: Normal  Communication   Communication: HOH (pt requests PT stand closely rather than speak loudly)  Cognition Arousal/Alertness:  Awake/alert Behavior During Therapy: WFL for tasks assessed/performed Overall Cognitive Status: Within Functional Limits for tasks assessed                      General Comments General comments (skin integrity, edema, etc.): Daughter and Granddaughter present throughout evaluation.    Exercises     Assessment/Plan    PT Assessment Patent does not need any further PT services  PT Problem List            PT Treatment Interventions      PT Goals (Current goals can be found  in the Care Plan section)  Acute Rehab PT Goals Patient Stated Goal: to go home PT Goal Formulation: With patient/family Time For Goal Achievement: 10/01/16 Potential to Achieve Goals: Good    Frequency     Barriers to discharge        Co-evaluation               End of Session Equipment Utilized During Treatment: Gait belt;Oxygen Activity Tolerance: Patient tolerated treatment well Patient left: in chair;with call bell/phone within reach;with family/visitor present Nurse Communication: Mobility status;Other (comment) (SpO2)         Time: [id number] PT Time Calculation (min) (ACUTE ONLY): 18 min   Charges:   PT Evaluation $PT Eval Low Complexity: 1 Procedure     PT G Codes:        Diana Meadows PT, DPT 09/17/2016, 3:10 PM

## 2016-09-17 NOTE — Care Management Note (Signed)
Case Management Note  Patient Details  Name: Diana Meadows MRN: [id number] Date of Birth: 04/18/1919  Subjective/Objective: Met with patient and daughter, Diana Meadows,  at bedside. Patient lives at home with daughter, Diana Meadows. She is independent and requires no assistive devices. PCP is Dr. Ruthann Cancer. Daughter wants to place patient in an assisted living facility. This is not feasible from the hospital. Explained to daughter. She is agreeable to go home with home health and prefers [facility] Patient has used them several times in the past. Will need SN ans SW. Referral to [facility]. Qualifies for home O2. Requested from [facility].                     Action/Plan: Plan is home today.   Expected Discharge Date:  09/16/16               Expected Discharge Plan:  [place]  In-House Referral:  Clinical Social Work  Discharge planning Services     Post Acute Care Choice:  Home Health Choice offered to:  Adult Children  DME Arranged:    DME Agency:     HH Arranged:  RN, Social Work [employer] Agency:  [place]  Status of Service:  In process, will continue to follow  If discussed at Long Length of Stay Meetings, dates discussed:    Additional Comments:  Jolly Mango, RN 09/17/2016, 11:38 AM

## 2016-09-17 NOTE — Care Management Important Message (Signed)
Important Message  Patient Details  Name: Diana Meadows MRN: [id number] Date of Birth: 08-07-1919   Medicare Important Message Given:  Yes    Marily MemosLisa M Duanna Runk, RN 09/17/2016, 10:48 AM

## 2016-09-17 NOTE — Discharge Summary (Signed)
[facility] [place] at [facility]   PATIENT NAME: Diana Meadows    MR#:  [id number]  DATE OF BIRTH:  [birth year]  DATE OF ADMISSION:  09/14/2016 ADMITTING PHYSICIAN: Oralia Manis, MD  DATE OF DISCHARGE: 09/17/2016  PRIMARY CARE PHYSICIAN: Lauro Regulus., MD    ADMISSION DIAGNOSIS:  Shortness of breath [R06.02]  DISCHARGE DIAGNOSIS:  Principal Problem:   CAP (community acquired pneumonia) Active Problems:   COPD (chronic obstructive pulmonary disease) (HCC)   GERD (gastroesophageal reflux disease)   Depression   SECONDARY DIAGNOSIS:   Past Medical History:  Diagnosis Date  . COPD (chronic obstructive pulmonary disease) (HCC)   . Depression   . GERD (gastroesophageal reflux disease)     HOSPITAL COURSE:   * CAP (community acquired pneumonia) bronchitis - IV antibiotics   Blood cultures sent from ED. When necessary antitussives and when necessary duo nebs for symptom control  no fever.  improved.  * Ac hypoxic respi failure   May have underlying lung damage due to remote smoking and old age.   She had decreased functional ability due to SOB for last few months.   Will arrange for home oxygen.  *COPD (chronic obstructive pulmonary disease) (HCC) - unclear history to this diagnosis, patient certainly is not on maintenance medications and has not ever had COPD flareups before. We will use duo nebs for symptom control as well as other treatments as above.  as per daughter for last few months patient has decreased functional ability( she cannot swim now)   she was a smoker for long time ago, she may have underlying undiagnosed COPD.  will give Inhaler and supplemental oxygen on discharge.  *GERD (gastroesophageal reflux disease) - not on home meds for this, we'll treat when necessary.   DISCHARGE CONDITIONS:   Stable.  CONSULTS OBTAINED:    DRUG ALLERGIES:   Allergies  Allergen Reactions  . Codeine   . Nsaids      DISCHARGE MEDICATIONS:   Current Discharge Medication List    START taking these medications   Details  azithromycin (ZITHROMAX) 250 MG tablet Take 1 tablet (250 mg total) by mouth daily. Qty: 3 each, Refills: 0    benzonatate (TESSALON) 200 MG capsule Take 1 capsule (200 mg total) by mouth 3 (three) times daily as needed for cough. Qty: 20 capsule, Refills: 0    cefUROXime (CEFTIN) 250 MG tablet Take 1 tablet (250 mg total) by mouth 2 (two) times daily with a meal. Qty: 6 tablet, Refills: 0    Fluticasone-Salmeterol (ADVAIR DISKUS) 100-50 MCG/DOSE AEPB Inhale 1 puff into the lungs 2 (two) times daily. ( New inhaler, need education on use) Qty: 60 each, Refills: 2    guaiFENesin-dextromethorphan (ROBITUSSIN DM) 100-10 MG/5ML syrup Take 5 mLs by mouth every 4 (four) hours as needed for cough. Qty: 118 mL, Refills: 0      CONTINUE these medications which have NOT CHANGED   Details  acetaminophen (TYLENOL) 500 MG tablet Take 1,000 mg by mouth 2 (two) times daily.    estradiol (ESTRACE) 0.5 MG tablet Take 0.5 mg by mouth daily.      STOP taking these medications     guaifenesin (ROBITUSSIN) 100 MG/5ML syrup          DISCHARGE INSTRUCTIONS:    Follow with Pulmonary clinic in  2 weeks.  If you experience worsening of your admission symptoms, develop shortness of breath, life threatening emergency, suicidal or homicidal thoughts you must seek medical attention immediately  by calling 911 or calling your MD immediately  if symptoms less severe.  You Must read complete instructions/literature along with all the possible adverse reactions/side effects for all the Medicines you take and that have been prescribed to you. Take any new Medicines after you have completely understood and accept all the possible adverse reactions/side effects.   Please note  You were cared for by a hospitalist during your hospital stay. If you have any questions about your discharge medications or  the care you received while you were in the hospital after you are discharged, you can call the unit and asked to speak with the hospitalist on call if the hospitalist that took care of you is not available. Once you are discharged, your primary care physician will handle any further medical issues. Please note that NO REFILLS for any discharge medications will be authorized once you are discharged, as it is imperative that you return to your primary care physician (or establish a relationship with a primary care physician if you do not have one) for your aftercare needs so that they can reassess your need for medications and monitor your lab values.    Today   CHIEF COMPLAINT:   Chief Complaint  Patient presents with  . Chest Pain  . Shortness of Breath    HISTORY OF PRESENT ILLNESS:  Diana Meadows  is a 83 y.o. female presents with 1 week of progressive shortness of breath and increasing cough. Patient has a history of COPD on the chart, but states that she was surprised when she saw this diagnosis as she takes no home inhalers and has not been informed prior to this that she had COPD. On presentation here to the ED she was febrile, mildly hypoxic requiring oxygen. Despite the lack of an elevated white blood cell count and no focal pneumonia on x-ray, her clinical picture best fits with pneumonia hospitalists were called for admission.  VITAL SIGNS:  Blood pressure (!) 133/52, pulse 77, temperature 98.4 F (36.9 C), temperature source Oral, resp. rate 18, height 5' 2.5" (1.588 m), weight 69.9 kg (154 lb 1.6 oz), SpO2 (!) 84 %.  I/O:   Intake/Output Summary (Last 24 hours) at 09/17/16 1121 Last data filed at 09/17/16 0948  Gross per 24 hour  Intake              840 ml  Output                0 ml  Net              840 ml    PHYSICAL EXAMINATION:   GENERAL:  83 y.o.-year-old patient lying in the bed with no acute distress.  EYES: Pupils equal, round, reactive to light and  accommodation. No scleral icterus. Extraocular muscles intact.  HEENT: Head atraumatic, normocephalic. Oropharynx and nasopharynx clear.  NECK:  Supple, no jugular venous distention. No thyroid enlargement, no tenderness.  LUNGS: Normal breath sounds bilaterally, some wheezing, some crepitation. No use of accessory muscles of respiration.  CARDIOVASCULAR: S1, S2 normal. No murmurs, rubs, or gallops.  ABDOMEN: Soft, nontender, nondistended. Bowel sounds present. No organomegaly or mass.  EXTREMITIES: No pedal edema, cyanosis, or clubbing.  NEUROLOGIC: Cranial nerves II through XII are intact. Muscle strength 5/5 in all extremities. Sensation intact. Gait not checked.  PSYCHIATRIC: The patient is alert and oriented x 3.  SKIN: No obvious rash, lesion, or ulcer.   DATA REVIEW:   CBC  Recent Labs Lab 09/15/16 0253  WBC 4.9  HGB 13.9  HCT 41.8  PLT 158    Chemistries   Recent Labs Lab 09/14/16 1647 09/15/16 0253  NA 139 138  K 4.0 4.3  CL 101 101  CO2 32 31  GLUCOSE 129* 99  BUN 16 15  CREATININE 0.87 0.75  CALCIUM 8.7* 8.1*  AST 28  --   ALT 14  --   ALKPHOS 88  --   BILITOT 0.3  --     Cardiac Enzymes No results for input(s): TROPONINI in the last 168 hours.  Microbiology Results  Results for orders placed or performed during the hospital encounter of 09/14/16  Rapid Influenza A&B Antigens ([facility] only)     Status: None   Collection Time: 09/14/16  4:47 PM  Result Value Ref Range Status   Influenza A ([facility]) NEGATIVE NEGATIVE Final   Influenza B ([facility]) NEGATIVE NEGATIVE Final  Blood culture (routine x 2)     Status: None (Preliminary result)   Collection Time: 09/14/16  4:47 PM  Result Value Ref Range Status   Specimen Description BLOOD L AC  Final   Special Requests BOTTLES DRAWN AEROBIC AND ANAEROBIC  12 ML  Final   Culture NO GROWTH 3 DAYS  Final   Report Status PENDING  Incomplete  Blood culture (routine x 2)     Status: None (Preliminary result)    Collection Time: 09/14/16  4:47 PM  Result Value Ref Range Status   Specimen Description BLOOD L AC  Final   Special Requests   Final    BOTTLES DRAWN AEROBIC AND ANAEROBIC ANA 10 AER 12ML   Culture NO GROWTH 3 DAYS  Final   Report Status PENDING  Incomplete    RADIOLOGY:  No results found.  EKG:   Orders placed or performed during the hospital encounter of 09/14/16  . EKG 12-Lead  . EKG 12-Lead      Management plans discussed with the patient, family and they are in agreement.  CODE STATUS:     Code Status Orders        Start     Ordered   09/14/16 2218  Full code  Continuous     09/14/16 2217    Code Status History    Date Active Date Inactive Code Status Order ID Comments User Context   This patient has a current code status but no historical code status.    Advance Directive Documentation   Flowsheet Row Most Recent Value  Type of Advance Directive  Healthcare Power of Attorney  Pre-existing out of facility DNR order (yellow form or pink MOST form)  No data  "MOST" Form in Place?  No data      TOTAL TIME TAKING CARE OF THIS PATIENT: 35 minutes.    Altamese DillingVACHHANI, Sofia Jaquith M.D on 09/17/2016 at 11:21 AM  Between 7am to 6pm - Pager - [phone]  After 6pm go to [email or web address] - password [employer] [email or web address]  [facility] [place] [facility]  Office  [phone]  CC: Primary care physician; Lauro RegulusANDERSON,MARSHALL W., MD   Note: This dictation was prepared with Dragon dictation along with smaller phrase technology. Any transcriptional errors that result from this process are unintentional.

## 2016-09-17 NOTE — Care Management (Signed)
O2 delivered to patient in rom

## 2016-09-19 LAB — CULTURE, BLOOD (ROUTINE X 2)
CULTURE: NO GROWTH
CULTURE: NO GROWTH

## 2017-01-29 ENCOUNTER — Inpatient Hospital Stay
Admission: EM | Admit: 2017-01-29 | Discharge: 2017-02-02 | DRG: 390 | Disposition: A | Discharge status: Home / Self Care | Payer: Medicare Other | Attending: General Surgery | Admitting: General Surgery

## 2017-01-29 ENCOUNTER — Emergency Department: Payer: Medicare Other

## 2017-01-29 ENCOUNTER — Encounter: Payer: Self-pay | Admitting: Emergency Medicine

## 2017-01-29 DIAGNOSIS — Z79899 Other long term (current) drug therapy: Secondary | ICD-10-CM | POA: Diagnosis not present

## 2017-01-29 DIAGNOSIS — K565 Intestinal adhesions [bands], unspecified as to partial versus complete obstruction: Secondary | ICD-10-CM | POA: Diagnosis present

## 2017-01-29 DIAGNOSIS — Z807 Family history of other malignant neoplasms of lymphoid, hematopoietic and related tissues: Secondary | ICD-10-CM

## 2017-01-29 DIAGNOSIS — Z87891 Personal history of nicotine dependence: Secondary | ICD-10-CM

## 2017-01-29 DIAGNOSIS — K219 Gastro-esophageal reflux disease without esophagitis: Secondary | ICD-10-CM | POA: Diagnosis present

## 2017-01-29 DIAGNOSIS — K56609 Unspecified intestinal obstruction, unspecified as to partial versus complete obstruction: Secondary | ICD-10-CM

## 2017-01-29 DIAGNOSIS — K439 Ventral hernia without obstruction or gangrene: Secondary | ICD-10-CM | POA: Diagnosis present

## 2017-01-29 DIAGNOSIS — K56600 Partial intestinal obstruction, unspecified as to cause: Principal | ICD-10-CM | POA: Diagnosis present

## 2017-01-29 DIAGNOSIS — J449 Chronic obstructive pulmonary disease, unspecified: Secondary | ICD-10-CM | POA: Diagnosis present

## 2017-01-29 HISTORY — DX: Unspecified intestinal obstruction, unspecified as to partial versus complete obstruction: K56.609

## 2017-01-29 LAB — URINALYSIS, COMPLETE (UACMP) WITH MICROSCOPIC
BACTERIA UA: NONE SEEN
Bilirubin Urine: NEGATIVE
GLUCOSE, UA: NEGATIVE mg/dL
HGB URINE DIPSTICK: NEGATIVE
KETONES UR: NEGATIVE mg/dL
Nitrite: NEGATIVE
Protein, ur: NEGATIVE mg/dL
Specific Gravity, Urine: 1.021 (ref 1.005–1.030)
pH: 5 (ref 5.0–8.0)

## 2017-01-29 LAB — COMPREHENSIVE METABOLIC PANEL
ALBUMIN: 3.6 g/dL (ref 3.5–5.0)
ALT: 35 U/L (ref 14–54)
AST: 31 U/L (ref 15–41)
Alkaline Phosphatase: 113 U/L (ref 38–126)
Anion gap: 6 (ref 5–15)
BUN: 21 mg/dL — AB (ref 6–20)
CO2: 32 mmol/L (ref 22–32)
CREATININE: 0.77 mg/dL (ref 0.44–1.00)
Calcium: 9.4 mg/dL (ref 8.9–10.3)
Chloride: 98 mmol/L — ABNORMAL LOW (ref 101–111)
GFR calc Af Amer: >60 mL/min (ref >60)
GFR calc non Af Amer: >60 mL/min (ref >60)
Glucose, Bld: 102 mg/dL — ABNORMAL HIGH (ref 65–99)
POTASSIUM: 4.5 mmol/L (ref 3.5–5.1)
Sodium: 136 mmol/L (ref 135–145)
Total Bilirubin: 0.6 mg/dL (ref 0.3–1.2)
Total Protein: 7.2 g/dL (ref 6.5–8.1)

## 2017-01-29 LAB — CBC
HEMATOCRIT: 44.3 % (ref 35.0–47.0)
Hemoglobin: 14.8 g/dL (ref 12.0–16.0)
MCH: 31.3 pg (ref 26.0–34.0)
MCHC: 33.5 g/dL (ref 32.0–36.0)
MCV: 93.5 fL (ref 80.0–100.0)
PLATELETS: 273 10*3/uL (ref 150–440)
RBC: 4.74 MIL/uL (ref 3.80–5.20)
RDW: 12.8 % (ref 11.5–14.5)
WBC: 11.7 10*3/uL — ABNORMAL HIGH (ref 3.6–11.0)

## 2017-01-29 LAB — LIPASE, BLOOD: Lipase: 37 U/L (ref 11–51)

## 2017-01-29 LAB — TROPONIN I: TROPONIN I: 0.03 ng/mL — AB (ref <0.03)

## 2017-01-29 MED ORDER — FENTANYL CITRATE (PF) 100 MCG/2ML IJ SOLN
50.0000 ug | Freq: Once | INTRAMUSCULAR | Status: AC
  Administered 2017-01-29: 50 ug via INTRAVENOUS
  Filled 2017-01-29: qty 2

## 2017-01-29 MED ORDER — ONDANSETRON HCL 4 MG/2ML IJ SOLN
4.0000 mg | Freq: Once | INTRAMUSCULAR | Status: AC
  Administered 2017-01-29: 4 mg via INTRAVENOUS
  Filled 2017-01-29: qty 2

## 2017-01-29 MED ORDER — SODIUM CHLORIDE 0.9 % IV BOLUS (SEPSIS)
500.0000 mL | Freq: Once | INTRAVENOUS | Status: AC
  Administered 2017-01-29: 500 mL via INTRAVENOUS

## 2017-01-29 MED ORDER — IOPAMIDOL (ISOVUE-300) INJECTION 61%
30.0000 mL | Freq: Once | INTRAVENOUS | Status: AC | PRN
  Administered 2017-01-29: 30 mL via ORAL

## 2017-01-29 MED ORDER — IOPAMIDOL (ISOVUE-300) INJECTION 61%
100.0000 mL | Freq: Once | INTRAVENOUS | Status: AC | PRN
  Administered 2017-01-29: 100 mL via INTRAVENOUS

## 2017-01-29 NOTE — ED Notes (Signed)
Pt finished drinking contrast at this time, CT called and informed.

## 2017-01-29 NOTE — ED Notes (Signed)
ED Provider at bedside. 

## 2017-01-29 NOTE — ED Notes (Signed)
Primary RN notified of elevated troponin.

## 2017-01-29 NOTE — ED Triage Notes (Signed)
C/O abdominal pain since 1500.  BP elevated.  Denies nausea/vomiting/diarrhea.

## 2017-01-29 NOTE — ED Provider Notes (Signed)
[facility] Emergency Department Provider Note  ____________________________________________   First MD Initiated Contact with Patient 01/29/17 2013     (approximate)  I have reviewed the triage vital signs and the nursing notes.   HISTORY  Chief Complaint Abdominal Pain   HPI Diana Meadows is a 83 y.o. female with a history of a bowel obstruction was presenting to the emergency department with diffuse abdominal pain and bloating since 3:30 PM this afternoon. She says that she has been belching and passing gas. Says that the pain is moderate at this time. No vomiting.   Past Medical History:  Diagnosis Date  . Bowel obstruction (HCC)   . COPD (chronic obstructive pulmonary disease) (HCC)   . Depression   . GERD (gastroesophageal reflux disease)     Patient Active Problem List   Diagnosis Date Noted  . COPD (chronic obstructive pulmonary disease) (HCC) 09/14/2016  . GERD (gastroesophageal reflux disease) 09/14/2016  . Depression 09/14/2016  . CAP (community acquired pneumonia) 09/14/2016    Past Surgical History:  Procedure Laterality Date  . ABDOMINAL HYSTERECTOMY    . CESAREAN SECTION     x 3  . CHOLECYSTECTOMY    . HERNIA REPAIR      Prior to Admission medications   Medication Sig Start Date End Date Taking? Authorizing Provider  acetaminophen (TYLENOL) 500 MG tablet Take 1,000 mg by mouth 2 (two) times daily.    [provider]  benzonatate (TESSALON) 200 MG capsule Take 1 capsule (200 mg total) by mouth 3 (three) times daily as needed for cough. Patient not taking: Reported on 01/29/2017 09/17/16   Altamese Dilling, MD  estradiol (ESTRACE) 0.5 MG tablet Take 0.5 mg by mouth daily. 08/18/16   [provider]  Fluticasone-Salmeterol (ADVAIR DISKUS) 100-50 MCG/DOSE AEPB Inhale 1 puff into the lungs 2 (two) times daily. ( New inhaler, need education on use) 09/17/16   Altamese Dilling, MD    guaiFENesin-dextromethorphan [facility] DM) 100-10 MG/5ML syrup Take 5 mLs by mouth every 4 (four) hours as needed for cough. Patient not taking: Reported on 01/29/2017 09/17/16   Altamese Dilling, MD    Allergies Codeine and Nsaids  Family History  Problem Relation Age of Onset  . Lymphoma Mother   . Tuberculosis Father     Social History Social History  Substance Use Topics  . Smoking status: Former Games developer  . Smokeless tobacco: Never Used  . Alcohol use Yes     Comment: occ    Review of Systems  Constitutional: No fever/chills Eyes: No visual changes. ENT: No sore throat. Cardiovascular: Denies chest pain. Respiratory: Denies shortness of breath. Gastrointestinal: no vomiting.  No diarrhea.  No constipation. Genitourinary: Negative for dysuria. Musculoskeletal: Negative for back pain. Skin: Negative for rash. Neurological: Negative for headaches, focal weakness or numbness.   ____________________________________________   PHYSICAL EXAM:  VITAL SIGNS: ED Triage Vitals  Enc Vitals Group     BP 01/29/17 1825 (!) 162/72     Pulse Rate 01/29/17 1825 87     Resp 01/29/17 1825 16     Temp 01/29/17 1825 98.2 F (36.8 C)     Temp Source 01/29/17 1825 Oral     SpO2 --      Weight 01/29/17 1824 150 lb (68 kg)     Height 01/29/17 1824 5\' 2"  (1.575 m)     Head Circumference --      Peak Flow --      Pain Score 01/29/17 1823  8     Pain Loc --      Pain Edu? --      Excl. in GC? --     Constitutional: Alert and oriented. Well appearing and in no acute distress. Eyes: Conjunctivae are normal.  Head: Atraumatic. Nose: No congestion/rhinnorhea. Mouth/Throat: Mucous membranes are moist.  Neck: No stridor.   Cardiovascular: Normal rate, regular rhythm. Grossly normal heart sounds.  Respiratory: Normal respiratory effort.  No retractions. Lungs CTAB. Gastrointestinal: Soft with mild distention and diffuse abdominal tenderness to palpation. Multiple abdominal  scars from past surgeries. Patient reports that she has had her gallbladder out as well as a hysterectomy and 3 C-sections. No CVA tenderness. Musculoskeletal: No lower extremity tenderness nor edema.  No joint effusions. Neurologic:  Normal speech and language. No gross focal neurologic deficits are appreciated. Skin:  Skin is warm, dry and intact. No rash noted. Psychiatric: Mood and affect are normal. Speech and behavior are normal.  ____________________________________________   LABS (all labs ordered are listed, but only abnormal results are displayed)  Labs Reviewed  COMPREHENSIVE METABOLIC PANEL - Abnormal; Notable for the following:       Result Value   Chloride 98 (*)    Glucose, Bld 102 (*)    BUN 21 (*)    All other components within normal limits  CBC - Abnormal; Notable for the following:    WBC 11.7 (*)    All other components within normal limits  URINALYSIS, COMPLETE (UACMP) WITH MICROSCOPIC - Abnormal; Notable for the following:    Color, Urine AMBER (*)    APPearance HAZY (*)    Leukocytes, UA TRACE (*)    Squamous Epithelial / LPF 0-5 (*)    All other components within normal limits  TROPONIN I - Abnormal; Notable for the following:    Troponin I 0.03 (*)    All other components within normal limits  LIPASE, BLOOD   ____________________________________________  EKG  ED ECG REPORT I, Arelia Longest, the attending physician, personally viewed and interpreted this ECG.   Date: 01/29/2017  EKG Time: 2031  Rate: 75  Rhythm: normal sinus rhythm  Axis: normal  Intervals:none  ST&T Change: No ST segment elevation or depression. No abnormal T-wave inversion.  ____________________________________________  RADIOLOGY  IMPRESSION: 1. Proximal small bowel obstruction which changes in in bowel caliber with lack of passage of oral contrast in the mid abdomen deep to the mesh hernia repair, probably due to adhesion. No significant inflammatory changes of  the bowel. 2. Large hiatal hernia. 3. Sigmoid diverticulosis without evidence for acute diverticulitis. 4. Aortic atherosclerosis. 5. Small ventral hernia superior to the mid abdominal wall mesh repair containing fat and side wall of transverse colon is stable in size.   Electronically Signed By: Mitzi Hansen M.D. On: 01/29/2017 22:58 ____________________________________________   PROCEDURES  Procedure(s) performed:   Procedures  Critical Care performed:   ____________________________________________   INITIAL IMPRESSION / ASSESSMENT AND PLAN / ED COURSE  Pertinent labs & imaging results that were available during my care of the patient were reviewed by me and considered in my medical decision making (see chart for details).  ----------------------------------------- 11:39 PM on 01/29/2017 -----------------------------------------  Patient without vomiting. However, found to have a small bowel obstruction on her CAT scan. Will be admitted to the hospital. Signed out the surgeon, Dr. Tonita Cong.  We will defer decision on NG tube insertion until after Dr. Tonita Cong sees the patient.      ____________________________________________   FINAL  CLINICAL IMPRESSION(S) / ED DIAGNOSES  Final diagnoses:  Small bowel obstruction (HCC)      NEW MEDICATIONS STARTED DURING THIS VISIT:  New Prescriptions   No medications on file     Note:  This document was prepared using Dragon voice recognition software and may include unintentional dictation errors.     Myrna BlazerSchaevitz, Mylea Roarty Matthew, MD 01/29/17 [phone]

## 2017-01-29 NOTE — H&P (Signed)
Patient ID: Diana HongPearl V Havrilla, female   DOB: Jul 04, 1919, 83 y.o.   MRN: [id number]  CC: ABDOMINAL PAIN  HPI Diana Meadows is a 83 y.o. female since emergency department with a one-day history of abdominal pain. She reports she is also has some abdominal bloating and nausea but no vomiting. She's been eating well and having bowel function. However, due to the severity of the pain she came to the emergency department. She states it feels similar to her prior bowel obstructions. She has required numerous abdominal surgeries, including surgeries for bowel obstruction. Her last surgery was in 2015 by one of our former partners. She denies any fevers, chills, chest pain, constipation. She has chronic shortness of breath. She has had loose, watery bowel movements today. She also reports recently having an upper respiratory infection for which she was on steroids antibiotics for. She completed those last week. Otherwise she is in her usual state of health.  HPI  Past Medical History:  Diagnosis Date  . Bowel obstruction (HCC)   . COPD (chronic obstructive pulmonary disease) (HCC)   . Depression   . GERD (gastroesophageal reflux disease)     Past Surgical History:  Procedure Laterality Date  . ABDOMINAL HYSTERECTOMY    . CESAREAN SECTION     x 3  . CHOLECYSTECTOMY    . HERNIA REPAIR      Family History  Problem Relation Age of Onset  . Lymphoma Mother   . Tuberculosis Father     Social History Social History  Substance Use Topics  . Smoking status: Former Games developermoker  . Smokeless tobacco: Never Used  . Alcohol use Yes     Comment: occ    Allergies  Allergen Reactions  . Codeine Other (See Comments)    Reaction: unknown  . Nsaids Other (See Comments)    Reaction: Upset stomach    No current facility-administered medications for this encounter.    Current Outpatient Prescriptions  Medication Sig Dispense Refill  . albuterol (PROVENTIL HFA;VENTOLIN HFA) 108 (90 Base) MCG/ACT inhaler  Inhale 2 puffs into the lungs every 6 (six) hours as needed.    Marland Kitchen. estradiol (ESTRACE) 0.5 MG tablet Take 1 tablet by mouth daily.    . Fluticasone-Salmeterol (ADVAIR DISKUS) 100-50 MCG/DOSE AEPB Inhale 1 puff into the lungs every 12 (twelve) hours.    Marland Kitchen. tiZANidine (ZANAFLEX) 2 MG tablet Take 1 tablet by mouth 3 (three) times daily as needed.    Marland Kitchen. acetaminophen (TYLENOL) 500 MG tablet Take 1,000 mg by mouth 2 (two) times daily.    Marland Kitchen. estradiol (ESTRACE) 0.5 MG tablet Take 0.5 mg by mouth daily.    . fluticasone (FLONASE) 50 MCG/ACT nasal spray Place 2 sprays into both nostrils daily.    . Fluticasone-Salmeterol (ADVAIR DISKUS) 100-50 MCG/DOSE AEPB Inhale 1 puff into the lungs 2 (two) times daily. ( New inhaler, need education on use) 60 each 2  . tiZANidine (ZANAFLEX) 2 MG tablet Take 2 mg by mouth 3 (three) times daily as needed for muscle spasms.       Review of Systems A Multi-point review of systems was asked and was negative except for the findings documented in the history of present illness  Physical Exam Blood pressure 105/82, pulse 75, temperature 98.2 F (36.8 C), temperature source Oral, resp. rate 18, height 5\' 2"  (1.575 m), weight 68 kg (150 lb), SpO2 95 %. CONSTITUTIONAL: No acute distress. EYES: Pupils are equal, round, and reactive to light, Sclera are non-icteric. EARS, NOSE,  MOUTH AND THROAT: The oropharynx is clear. The oral mucosa is pink and moist. Hearing is intact to voice. LYMPH NODES:  Lymph nodes in the neck are normal. RESPIRATORY:  Lungs are clear. There is normal respiratory effort, with equal breath sounds bilaterally, and without pathologic use of accessory muscles. CARDIOVASCULAR: Heart is regular without murmurs, gallops, or rubs. GI: The abdomen is large, soft, tender to palpation in the upper midline but without peritoneal signs, and mildly distended. There are no palpable masses. There is no hepatosplenomegaly. There are normal bowel sounds in all  quadrants. GU: Rectal deferred.   MUSCULOSKELETAL: Normal muscle strength and tone. No cyanosis or edema.   SKIN: Turgor is good and there are no pathologic skin lesions or ulcers. NEUROLOGIC: Motor and sensation is grossly normal. Cranial nerves are grossly intact. PSYCH:  Oriented to person, place and time. Affect is normal.  Data Reviewed Images and labs reviewed. Labs show mild leukocytosis of 11.7, otherwise appeared within normal limits. Including normal urinalysis. CT scan of the abdomen does show dilated proximal loops of small bowel. There are decompressed loops of small bowel without a contrasted and seen on the CT scan. There is also a midline ventral hernia that has a sidewall of the colon within it. No evidence of free air or free fluid. I have personally reviewed the patient's imaging, laboratory findings and medical records.    Assessment    Small bowel obstruction    Plan    83 year old female with a small bowel obstruction. Transition point noted on CT scan. Discussed with the patient and her family members who are present about the diagnosis and the treatment plan. Plan for admission, IV hydration, bowel rest. Should she develop vomiting that an NG tube would be indicated. However, in the absence of vomiting we will forego an NG tube for now. We will obtain plain film abdominal x-rays tomorrow to evaluate whether or not contrast has progressed within her bowel. I will ask the hospitalist team to see the patient in consultation to assist with her COPD and other chronic medications. All questions answered to the patient's satisfaction. Plan for admission.     Time spent with the patient was 50 minutes, with more than 50% of the time spent in face-to-face education, counseling and care coordination.     Ricarda Frame, MD FACS General Surgeon 01/29/2017, 11:51 PM

## 2017-01-29 NOTE — ED Notes (Signed)
Pt returned from CT °

## 2017-01-30 ENCOUNTER — Inpatient Hospital Stay: Payer: Medicare Other

## 2017-01-30 LAB — CBC
HEMATOCRIT: 38.9 % (ref 35.0–47.0)
Hemoglobin: 13.2 g/dL (ref 12.0–16.0)
MCH: 32.5 pg (ref 26.0–34.0)
MCHC: 34 g/dL (ref 32.0–36.0)
MCV: 95.5 fL (ref 80.0–100.0)
Platelets: 224 10*3/uL (ref 150–440)
RBC: 4.07 MIL/uL (ref 3.80–5.20)
RDW: 12.7 % (ref 11.5–14.5)
WBC: 10.5 10*3/uL (ref 3.6–11.0)

## 2017-01-30 LAB — BASIC METABOLIC PANEL
Anion gap: 5 (ref 5–15)
BUN: 18 mg/dL (ref 6–20)
CO2: 33 mmol/L — AB (ref 22–32)
Calcium: 8.4 mg/dL — ABNORMAL LOW (ref 8.9–10.3)
Chloride: 101 mmol/L (ref 101–111)
Creatinine, Ser: 0.67 mg/dL (ref 0.44–1.00)
GFR calc Af Amer: >60 mL/min (ref >60)
GFR calc non Af Amer: >60 mL/min (ref >60)
GLUCOSE: 133 mg/dL — AB (ref 65–99)
POTASSIUM: 4.2 mmol/L (ref 3.5–5.1)
Sodium: 139 mmol/L (ref 135–145)

## 2017-01-30 LAB — MAGNESIUM: Magnesium: 1.8 mg/dL (ref 1.7–2.4)

## 2017-01-30 LAB — MRSA PCR SCREENING: MRSA by PCR: NEGATIVE

## 2017-01-30 LAB — PHOSPHORUS: Phosphorus: 4 mg/dL (ref 2.5–4.6)

## 2017-01-30 LAB — TROPONIN I: Troponin I: <0.03 ng/mL (ref <0.03)

## 2017-01-30 MED ORDER — ONDANSETRON HCL 4 MG/2ML IJ SOLN: 4.0000 mg | Freq: Four times a day (QID) | INTRAMUSCULAR | Status: DC | PRN

## 2017-01-30 MED ORDER — DEXTROSE IN LACTATED RINGERS 5 % IV SOLN
INTRAVENOUS | Status: DC
  Administered 2017-01-30: 02:00:00 via INTRAVENOUS

## 2017-01-30 MED ORDER — HYDRALAZINE HCL 20 MG/ML IJ SOLN: 10.0000 mg | INTRAMUSCULAR | Status: DC | PRN

## 2017-01-30 MED ORDER — DIPHENHYDRAMINE HCL 50 MG/ML IJ SOLN: 12.5000 mg | Freq: Four times a day (QID) | INTRAMUSCULAR | Status: DC | PRN

## 2017-01-30 MED ORDER — MORPHINE SULFATE (PF) 4 MG/ML IV SOLN: 4.0000 mg | INTRAVENOUS | Status: DC | PRN

## 2017-01-30 MED ORDER — DIPHENHYDRAMINE HCL 12.5 MG/5ML PO ELIX: 12.5000 mg | ORAL_SOLUTION | Freq: Four times a day (QID) | ORAL | Status: DC | PRN

## 2017-01-30 MED ORDER — ALBUTEROL SULFATE (2.5 MG/3ML) 0.083% IN NEBU: 2.5000 mg | INHALATION_SOLUTION | Freq: Four times a day (QID) | RESPIRATORY_TRACT | Status: DC | PRN

## 2017-01-30 MED ORDER — MOMETASONE FURO-FORMOTEROL FUM 100-5 MCG/ACT IN AERO
2.0000 | INHALATION_SPRAY | Freq: Two times a day (BID) | RESPIRATORY_TRACT | Status: DC
  Administered 2017-01-30 – 2017-02-02 (×8): 2 via RESPIRATORY_TRACT
  Filled 2017-01-30: qty 8.8

## 2017-01-30 MED ORDER — KETOROLAC TROMETHAMINE 15 MG/ML IJ SOLN
15.0000 mg | Freq: Four times a day (QID) | INTRAMUSCULAR | Status: AC
  Administered 2017-01-30: 15 mg via INTRAVENOUS
  Filled 2017-01-30: qty 1

## 2017-01-30 MED ORDER — MORPHINE SULFATE (PF) 2 MG/ML IV SOLN
2.0000 mg | INTRAVENOUS | Status: DC | PRN
Start: 2017-01-30 — End: 2017-01-30
  Administered 2017-01-30: 2 mg via INTRAVENOUS
  Filled 2017-01-30: qty 1

## 2017-01-30 MED ORDER — KCL IN DEXTROSE-NACL 20-5-0.45 MEQ/L-%-% IV SOLN
INTRAVENOUS | Status: DC
  Administered 2017-01-30: 10:00:00 via INTRAVENOUS
  Filled 2017-01-30 (×3): qty 1000

## 2017-01-30 MED ORDER — ONDANSETRON 4 MG PO TBDP: 4.0000 mg | ORAL_TABLET | Freq: Four times a day (QID) | ORAL | Status: DC | PRN

## 2017-01-30 MED ORDER — PANTOPRAZOLE SODIUM 40 MG IV SOLR
40.0000 mg | Freq: Every day | INTRAVENOUS | Status: DC
  Administered 2017-01-30 – 2017-02-01 (×4): 40 mg via INTRAVENOUS
  Filled 2017-01-30 (×4): qty 40

## 2017-01-30 MED ORDER — ENOXAPARIN SODIUM 40 MG/0.4ML ~~LOC~~ SOLN
40.0000 mg | SUBCUTANEOUS | Status: DC
  Administered 2017-01-30 – 2017-02-01 (×3): 40 mg via SUBCUTANEOUS
  Filled 2017-01-30 (×3): qty 0.4

## 2017-01-30 MED ORDER — POLYETHYLENE GLYCOL 3350 17 G PO PACK
17.0000 g | PACK | Freq: Every day | ORAL | Status: DC
  Administered 2017-01-30 – 2017-01-31 (×2): 17 g via ORAL
  Filled 2017-01-30 (×2): qty 1

## 2017-01-30 NOTE — Progress Notes (Signed)
Called Dr. Tonita CongWoodham regarding pain medication for patient.  Appropriate orders were placed.  Arturo MortonClay, Zalman Hull N  01/30/2017  3:39 AM

## 2017-01-30 NOTE — Progress Notes (Signed)
01/30/2017  Subjective: Patient admitted overnight with abdominal pain and distention and CT scan findings concerning for small bowel obstruction.  This morning, patient reports feeling much better and had some flatus last night but none this morning so far.  Denies any nausea or vomiting.  KUB this morning shows contrast reaching down to descending colon.  Vital signs: Temp:  [97.6 F (36.4 C)-98.2 F (36.8 C)] 98 F (36.7 C) (05/23 0456) Pulse Rate:  [72-87] 75 (05/23 0622) Resp:  [13-19] 16 (05/23 0456) BP: (89-183)/(37-82) 155/61 (05/23 0622) SpO2:  [94 %-100 %] 100 % (05/23 0456) Weight:  [68 kg (150 lb)] 68 kg (150 lb) (05/22 1824)   Intake/Output: 05/22 0701 - 05/23 0700 In: 660.4 [I.V.:660.4] Out: 600 [Urine:600] Last BM Date: 01/30/17  Physical Exam: Constitutional: No acute distress Abdomen:  Soft, mildly distended, with some soreness to palpation over the mid abdomen and region of her known upper ventral hernia.  Labs:   Recent Labs  01/29/17 1831 01/30/17 0431  WBC 11.7* 10.5  HGB 14.8 13.2  HCT 44.3 38.9  PLT 273 224    Recent Labs  01/29/17 1831 01/30/17 0431  NA 136 139  K 4.5 4.2  CL 98* 101  CO2 32 33*  GLUCOSE 102* 133*  BUN 21* 18  CREATININE 0.77 0.67  CALCIUM 9.4 8.4*   No results for input(s): LABPROT, INR in the last 72 hours.  Imaging: Ct Abdomen Pelvis W Contrast  Result Date: 01/29/2017 CLINICAL DATA:  83 y/o F; generalized abdominal pain. Hypertension. EXAM: CT ABDOMEN AND PELVIS WITH CONTRAST TECHNIQUE: Multidetector CT imaging of the abdomen and pelvis was performed using the standard protocol following bolus administration of intravenous contrast. CONTRAST:  100mL ISOVUE-300 IOPAMIDOL (ISOVUE-300) INJECTION 61% COMPARISON:  05/14/2014 CT abdomen and pelvis. FINDINGS: Lower chest: Large hiatal hernia. Hepatobiliary: Status post cholecystectomy. No focal liver lesion. Mild stable prominence of the intra and extrahepatic bile ducts is  likely compensatory post cholecystectomy. Pancreas: Unremarkable. No pancreatic ductal dilatation or surrounding inflammatory changes. Spleen: Normal in size without focal abnormality. Adrenals/Urinary Tract: Multiple bilateral renal cysts. No urinary stone disease or hydronephrosis. Normal bladder. Stomach/Bowel: Proximal small bowel dilatation with transition in bowel caliber and lack of passage of oral contrast in the mid abdomen deep to mesh hernia repair (series 2, image 39). Small bowel downstream to this point is decompressed as is the colon. Normal appendix. Extensive sigmoid diverticulosis. Vascular/Lymphatic: Aortic atherosclerosis. No enlarged abdominal or pelvic lymph nodes. Reproductive: Status post hysterectomy. No adnexal masses. Other: Superior to the mesh repair of the ventral abdominal wall there is a small midline hernia containing fat and side wall of transverse colon without appreciable inflammatory change, the hernia is stable in size and (series 2, image 20 [phone]). Musculoskeletal: No acute or significant osseous findings. IMPRESSION: 1. Proximal small bowel obstruction which changes in in bowel caliber with lack of passage of oral contrast in the mid abdomen deep to the mesh hernia repair, probably due to adhesion. No significant inflammatory changes of the bowel. 2. Large hiatal hernia. 3. Sigmoid diverticulosis without evidence for acute diverticulitis. 4. Aortic atherosclerosis. 5. Small ventral hernia superior to the mid abdominal wall mesh repair containing fat and side wall of transverse colon is stable in size. Electronically Signed   By: Mitzi HansenLance  Furusawa-Stratton M.D.   On: 01/29/2017 22:58   Dg Abd Portable 2v  Result Date: 01/30/2017 CLINICAL DATA:  Small-bowel obstruction . EXAM: PORTABLE ABDOMEN - 2 VIEW COMPARISON:  CT 01/29/2017. FINDINGS:  Surgical clips right upper quadrant . Persistent distended loops of small bowel. Oral contrast is noted throughout the colon. IV  contrast in the kidneys and bladder from prior CT. No free air. Degenerative changes lumbar spine and both hips. IMPRESSION: Persistent small bowel distention. Oral contrast noted throughout the colon. Findings consistent with partial small bowel obstruction. No free air. Electronically Signed   By: Maisie Fus  Register   On: 01/30/2017 07:33    Assessment/Plan: 83 yo female with small bowel obstruction, now improving.  --Will start the patient on clear liquid diet today.  --Decrease IVF and will discontinue when she's tolerating appropriate po to stay hydrated. --Monitor bowel function and progress. --No acute surgical need at this point, but the patient does understand she may require surgery if she does not continue to improve or if she worsens.   Howie Ill, MD [facility]

## 2017-01-30 NOTE — ED Notes (Signed)
Pt transported to room 207 

## 2017-01-31 LAB — BASIC METABOLIC PANEL
ANION GAP: 3 — AB (ref 5–15)
BUN: 10 mg/dL (ref 6–20)
CALCIUM: 8.2 mg/dL — AB (ref 8.9–10.3)
CO2: 33 mmol/L — ABNORMAL HIGH (ref 22–32)
Chloride: 103 mmol/L (ref 101–111)
Creatinine, Ser: 0.83 mg/dL (ref 0.44–1.00)
GFR calc Af Amer: >60 mL/min (ref >60)
GFR, EST NON AFRICAN AMERICAN: 58 mL/min — AB (ref >60)
GLUCOSE: 91 mg/dL (ref 65–99)
Potassium: 4.1 mmol/L (ref 3.5–5.1)
Sodium: 139 mmol/L (ref 135–145)

## 2017-01-31 MED ORDER — MORPHINE SULFATE (PF) 2 MG/ML IV SOLN: 2.0000 mg | INTRAVENOUS | Status: DC | PRN

## 2017-01-31 NOTE — Progress Notes (Signed)
01/31/2017  Subjective: No acute events overnight. Patient yesterday had flatus and a bowel movement per her report and she was advanced to clear liquids and subsequently to full liquid diet. She had tolerated this well yesterday but this morning she reports that she feels slightly more bloated and some more discomfort. Reports still passing flatus.  Vital signs: Temp:  [97.9 F (36.6 C)-98.4 F (36.9 C)] 98.2 F (36.8 C) (05/24 0531) Pulse Rate:  [66-71] 70 (05/24 0531) Resp:  [18] 18 (05/24 0531) BP: (106-151)/(31-55) 151/50 (05/24 0531) SpO2:  [91 %-99 %] 91 % (05/24 0531)   Intake/Output: 05/23 0701 - 05/24 0700 In: 1227.5 [P.O.:760; I.V.:467.5] Out: 2300 [Urine:2300] Last BM Date: 01/30/17  Physical Exam: Constitutional: No acute distress Abdomen:  Soft, mildly distended, with some discomfort over the mid abdomen which is improved compared to yesterday morning is slightly worse compared to yesterday night.  Labs:   Recent Labs  01/29/17 1831 01/30/17 0431  WBC 11.7* 10.5  HGB 14.8 13.2  HCT 44.3 38.9  PLT 273 224    Recent Labs  01/30/17 0431 01/31/17 0421  NA 139 139  K 4.2 4.1  CL 101 103  CO2 33* 33*  GLUCOSE 133* 91  BUN 18 10  CREATININE 0.67 0.83  CALCIUM 8.4* 8.2*   No results for input(s): LABPROT, INR in the last 72 hours.  Imaging: No results found.  Assessment/Plan: 83 year old female with small bowel obstruction   -We'll continue patient on a full liquid diet today given her mild worsening of symptoms. We'll advance the diet once the patient starts feeling better or passes more flatus or bowel movements more consistently. Encouraged the patient to continue ambulation.   Howie IllJose Luis Jamielynn Wigley, MD [facility]

## 2017-01-31 NOTE — Care Management (Addendum)
Patient admitted with SBO.  Patient from [facility].  Per nursing patient is independent.  Patient has chronic O2 through [facility].  Patient has previously been open with [facility] for Home health services.  No RNCM needs identified at this time.

## 2017-02-01 NOTE — Clinical Social Work Note (Signed)
Clinical Social Work Assessment  Patient Details  Name: Diana Meadows MRN: [id number] Date of Birth: 1918/09/21  Date of referral:  02/01/17               Reason for consult:  Facility Placement                Permission sought to share information with:  Facility Sport and exercise psychologist, Family Supports Permission granted to share information::  Yes, Verbal Permission Granted  Name::        Agency::     Relationship::     Contact Information:     Housing/Transportation Living arrangements for the past 2 months:  [place] of Information:  Patient, Adult Children Patient Interpreter Needed:  None Criminal Activity/Legal Involvement Pertinent to Current Situation/Hospitalization:  No - Comment as needed Significant Relationships:  Adult Children Lives with:  Facility Resident Do you feel safe going back to the place where you live?  Yes Need for family participation in patient care:  Yes (Comment)  Care giving concerns:  Patient resides at [facility].   Social Worker assessment / plan:  CSW met with patient and patient's daughter, Henderson Newcomer: [phone] today in her hospital room. Patient is independent in all adl's and will return to [place] at discharge. Patient's daughter stated she would transport her back to [place]. Patient and daughter were very pleasant.   Employment status:  Retired Forensic scientist:  Medicare PT Recommendations:    Information / Referral to community resources:     Patient/Family's Response to care:  Patient and daughter expressed appreciation.   Patient/Family's Understanding of and Emotional Response to Diagnosis, Current Treatment, and Prognosis:  Patient and daughter are very aware of patient's progress.  Emotional Assessment Appearance:  Appears younger than stated age Attitude/Demeanor/Rapport:   (Pleasant and cooperative) Affect (typically observed):  Accepting, Calm, Pleasant Orientation:  Oriented to  Self, Oriented to Place, Oriented to  Time, Oriented to Situation Alcohol / Substance use:  Not Applicable Psych involvement (Current and /or in the community):  No (Comment)  Discharge Needs  Concerns to be addressed:  Care Coordination Readmission within the last 30 days:  No Current discharge risk:  None Barriers to Discharge:  No Barriers Identified   Shela Leff, LCSW 02/01/2017, 11:12 AM

## 2017-02-01 NOTE — Care Management Important Message (Signed)
Important Message  Patient Details  Name: Diana Meadows MRN: [id number] Date of Birth: 1919-07-19   Medicare Important Message Given:  Yes    Chapman FitchBOWEN, [name], RN 02/01/2017, 3:52 PM

## 2017-02-01 NOTE — Progress Notes (Signed)
CC: Abdominal pain Subjective: Patient was admitted with a small bowel obstruction and a ventral hernia. Her abdominal pain and obstruction are much improved. However she reports a feeling of bloating and belching after eating this morning. She is an bleeding well and having bowel function. She denies any nausea or vomiting.  Objective: Vital signs in last 24 hours: Temp:  [97.7 F (36.5 C)-98.1 F (36.7 C)] 98.1 F (36.7 C) (05/25 0430) Pulse Rate:  [59-79] 59 (05/25 0430) Resp:  [18-20] 18 (05/25 0430) BP: (146-153)/(52-62) 153/52 (05/25 0430) SpO2:  [96 %-100 %] 100 % (05/25 0430) Last BM Date: 01/31/17  Intake/Output from previous day: 05/24 0701 - 05/25 0700 In: 1000 [P.O.:1000] Out: 1500 [Urine:1500] Intake/Output this shift: Total I/O In: 240 [P.O.:240] Out: 500 [Urine:500]  Physical exam:  Gen.: No acute distress Chest: Clear to all sedation Heart: Regular rhythm Abdomen: Soft, nontender, nondistended  Lab Results: CBC   Recent Labs  01/29/17 1831 01/30/17 0431  WBC 11.7* 10.5  HGB 14.8 13.2  HCT 44.3 38.9  PLT 273 224   BMET  Recent Labs  01/30/17 0431 01/31/17 0421  NA 139 139  K 4.2 4.1  CL 101 103  CO2 33* 33*  GLUCOSE 133* 91  BUN 18 10  CREATININE 0.67 0.83  CALCIUM 8.4* 8.2*   PT/INR No results for input(s): LABPROT, INR in the last 72 hours. ABG No results for input(s): PHART, HCO3 in the last 72 hours.  Invalid input(s): PCO2, PO2  Studies/Results: No results found.  Anti-infectives: Anti-infectives    None      Assessment/Plan:  83 year old female with a resolved small bowel obstruction. With her feeling of bloating and belching this morning discussed continuing to watch her in the hospital today. Given her ventral hernia she is at risk for recurrence of her obstruction however she clinically is much improved from the time of admission. Encourage ambulation, incentive spirometer usage, oral intake. Hopefully discharge back  to her nursing facility in the next day or 2.  Kaitlyn Franko T. Tonita CongWoodham, MD, [facility] General Surgeon [facility]  Day ASCOM [phone](7a-7p) [phone] Night ASCOM [phone](7p-7a) [phone] 02/01/2017

## 2017-02-02 NOTE — Discharge Instructions (Signed)
Small Bowel Obstruction °A small bowel obstruction means that something is blocking the small bowel. The small bowel is also called the small intestine. It is the long tube that connects the stomach to the colon. An obstruction will stop food and fluids from passing through the small bowel. Treatment depends on what is causing the problem and how bad the problem is. °Follow these instructions at home: °· Get a lot of rest. °· Follow your diet as told by your doctor. You may need to: °¨ Only drink clear liquids until you start to get better. °¨ Avoid solid foods as told by your doctor. °· Take over-the-counter and prescription medicines only as told by your doctor. °· Keep all follow-up visits as told by your doctor. This is important. °Contact a doctor if: °· You have a fever. °· You have chills. °Get help right away if: °· You have pain or cramps that get worse. °· You throw up (vomit) blood. °· You have a feeling of being sick to your stomach (nausea) that does not go away. °· You cannot stop throwing up. °· You cannot drink fluids. °· You feel confused. °· You feel dry or thirsty (dehydrated). °· Your belly gets more bloated. °· You feel weak or you pass out (faint). °This information is not intended to replace advice given to you by your health care provider. Make sure you discuss any questions you have with your health care provider. °Document Released: 10/04/2004 Document Revised: 04/23/2016 Document Reviewed: 10/21/2014 °Elsevier Interactive Patient Education © 2017 Elsevier Inc. ° °

## 2017-02-02 NOTE — NC FL2 (Signed)
[place] MEDICAID FL2 LEVEL OF CARE SCREENING TOOL     IDENTIFICATION  Patient Name: Diana Meadows Birthdate: 03/01/1919 Sex: female Admission Date (Current Location): 01/29/2017  [facility] [place]   Facility and Address:  [facility], [address], [place], [place] [address]      Provider Number: [id number]  Attending Physician Name and Address:  Ricarda FrameWoodham, Charles, MD  Relative Name and Phone Number:       Current Level of Care: Hospital Recommended Level of Care: Skilled Nursing Facility Prior Approval Number:    Date Approved/Denied:   PASRR Number:    Discharge Plan:  (ALF)    Current Diagnoses: Patient Active Problem List   Diagnosis Date Noted  . Small bowel obstruction (HCC) 01/29/2017  . COPD (chronic obstructive pulmonary disease) (HCC) 09/14/2016  . GERD (gastroesophageal reflux disease) 09/14/2016  . Depression 09/14/2016  . CAP (community acquired pneumonia) 09/14/2016    Orientation RESPIRATION BLADDER Height & Weight     Self, Time, Situation, Place  Normal Continent Weight: 150 lb (68 kg) Height:  5\' 2"  (157.5 cm)  BEHAVIORAL SYMPTOMS/MOOD NEUROLOGICAL BOWEL NUTRITION STATUS   (none)  (none) Continent Diet (soft)  AMBULATORY STATUS COMMUNICATION OF NEEDS Skin   Independent Verbally Normal                       Personal Care Assistance Level of Assistance  Bathing, Dressing Bathing Assistance: Independent   Dressing Assistance: Independent     Functional Limitations Info  Hearing Sight Info: Impaired Hearing Info: Impaired      SPECIAL CARE FACTORS FREQUENCY                       Contractures Contractures Info: Not present    Additional Factors Info                  Current Medications (02/02/2017):  This is the current hospital active medication list Current Facility-Administered Medications  Medication Dose Route Frequency Provider Last Rate Last Dose  .  albuterol (PROVENTIL) (2.5 MG/3ML) 0.083% nebulizer solution 2.5 mg  2.5 mg Inhalation Q6H PRN Ricarda FrameWoodham, Charles, MD      . diphenhydrAMINE (BENADRYL) 12.5 MG/5ML elixir 12.5 mg  12.5 mg Oral Q6H PRN Ricarda FrameWoodham, Charles, MD       Or  . diphenhydrAMINE (BENADRYL) injection 12.5 mg  12.5 mg Intravenous Q6H PRN Ricarda FrameWoodham, Charles, MD      . enoxaparin (LOVENOX) injection 40 mg  40 mg Subcutaneous Q24H Ricarda FrameWoodham, Charles, MD   40 mg at 02/01/17 2037  . hydrALAZINE (APRESOLINE) injection 10 mg  10 mg Intravenous Q2H PRN Ricarda FrameWoodham, Charles, MD      . mometasone-formoterol (DULERA) 100-5 MCG/ACT inhaler 2 puff  2 puff Inhalation BID Ricarda FrameWoodham, Charles, MD   2 puff at 02/02/17 0800  . morphine 2 MG/ML injection 2 mg  2 mg Intravenous Q4H PRN Piscoya, Jose, MD      . ondansetron (ZOFRAN-ODT) disintegrating tablet 4 mg  4 mg Oral Q6H PRN Ricarda FrameWoodham, Charles, MD       Or  . ondansetron (ZOFRAN) injection 4 mg  4 mg Intravenous Q6H PRN Ricarda FrameWoodham, Charles, MD      . pantoprazole (PROTONIX) injection 40 mg  40 mg Intravenous QHS Ricarda FrameWoodham, Charles, MD   40 mg at 02/01/17 2044  . polyethylene glycol (MIRALAX / GLYCOLAX) packet 17 g  17 g Oral Daily Henrene DodgePiscoya, Jose, MD  17 g at 01/31/17 1478     Discharge Medications: Medication List    TAKE these medications   albuterol 108 (90 Base) MCG/ACT inhaler Commonly known as:  PROVENTIL HFA;VENTOLIN HFA Inhale 2 puffs into the lungs every 6 (six) hours as needed.   estradiol 0.5 MG tablet Commonly known as:  ESTRACE Take 1 tablet by mouth daily.   fluticasone 50 MCG/ACT nasal spray Commonly known as:  FLONASE Place 2 sprays into both nostrils daily.   Fluticasone-Salmeterol 100-50 MCG/DOSE Aepb Commonly known as:  ADVAIR DISKUS Inhale 1 puff into the lungs 2 (two) times daily. ( New inhaler, need education on use)   tiZANidine 2 MG tablet Commonly known as:  ZANAFLEX Take 2 mg by mouth 3 (three) times daily as needed for muscle spasms.         Relevant  Imaging Results:  Relevant Lab Results:   Additional Information    Judi Cong, LCSW

## 2017-02-02 NOTE — Final Progress Note (Signed)
CC: Small bowel obstruction Subjective: Patient reports doing well, tolerating diet, having bowel function  Objective: Vital signs in last 24 hours: Temp:  [97.7 F (36.5 C)-98.6 F (37 C)] 97.7 F (36.5 C) (05/26 0446) Pulse Rate:  [64-69] 65 (05/26 0446) Resp:  [18-22] 22 (05/26 0446) BP: (98-162)/(40-72) 162/65 (05/26 0446) SpO2:  [94 %-100 %] 94 % (05/26 0446) Last BM Date: 02/01/17  Intake/Output from previous day: 05/25 0701 - 05/26 0700 In: 720 [P.O.:720] Out: 2100 [Urine:2100] Intake/Output this shift: Total I/O In: -  Out: 200 [Urine:200]  Physical exam:  Gen.: No acute distress Chest: Clear to sedation Heart: Regular rate and rhythm Abdomen: Soft, nontender, nondistended  Lab Results: CBC  No results for input(s): WBC, HGB, HCT, PLT in the last 72 hours. BMET  Recent Labs  01/31/17 0421  NA 139  K 4.1  CL 103  CO2 33*  GLUCOSE 91  BUN 10  CREATININE 0.83  CALCIUM 8.2*   PT/INR No results for input(s): LABPROT, INR in the last 72 hours. ABG No results for input(s): PHART, HCO3 in the last 72 hours.  Invalid input(s): PCO2, PO2  Studies/Results: No results found.  Anti-infectives: Anti-infectives    None      Assessment/Plan:  83 year old female with a resolved small bowel obstruction. Patient to be discharged home today.  Jolita Haefner T. Tonita CongWoodham, MD, [facility] General Surgeon [facility]  Day ASCOM [phone](7a-7p) [phone] Night ASCOM [phone](7p-7a) [phone] 02/02/2017

## 2017-02-02 NOTE — Discharge Summary (Signed)
Patient ID: Diana Meadows MRN: [id number] DOB/AGE: 1918-10-26 83 y.o.  Admit date: 01/29/2017 Discharge date: 02/02/2017  Discharge Diagnoses:  Small Bowel Obstruction  Procedures Performed: Bowel rest  Discharged Condition: good  Hospital Course: 83 year old female admitted with a small bowel obstruction. Obstruction resolved with bowel rest. On the day of discharge her abdomen was soft, nontender, nondistended. She was tolerating a regular diet and having bowel function.  Discharge Orders: Discharge Instructions    Call MD for:  persistant nausea and vomiting    Complete by:  As directed    Call MD for:  severe uncontrolled pain    Complete by:  As directed    Call MD for:  temperature >100.4    Complete by:  As directed    Diet - low sodium heart healthy    Complete by:  As directed    Increase activity slowly    Complete by:  As directed       Disposition: 06-Home-Health Care Svc  Discharge Medications: Allergies as of 02/02/2017      Reactions   Codeine Other (See Comments)   Reaction: unknown   Nsaids Other (See Comments)   Reaction: Upset stomach      Medication List    TAKE these medications   albuterol 108 (90 Base) MCG/ACT inhaler Commonly known as:  PROVENTIL HFA;VENTOLIN HFA Inhale 2 puffs into the lungs every 6 (six) hours as needed.   estradiol 0.5 MG tablet Commonly known as:  ESTRACE Take 1 tablet by mouth daily.   fluticasone 50 MCG/ACT nasal spray Commonly known as:  FLONASE Place 2 sprays into both nostrils daily.   Fluticasone-Salmeterol 100-50 MCG/DOSE Aepb Commonly known as:  ADVAIR DISKUS Inhale 1 puff into the lungs 2 (two) times daily. ( New inhaler, need education on use)   tiZANidine 2 MG tablet Commonly known as:  ZANAFLEX Take 2 mg by mouth 3 (three) times daily as needed for muscle spasms.        Follwup: Follow-up Information    Lauro RegulusAnderson, Marshall W, MD. Schedule an appointment as soon as possible for a visit in 1  week(s).   Specialty:  Internal Medicine Why:  hospital follow up Contact information: [address] [facility] [place] [address] [phone]           Signed: Ricarda Meadows Diana Meadows 02/02/2017, 9:44 AM

## 2017-02-07 ENCOUNTER — Telehealth: Payer: Self-pay

## 2017-02-07 NOTE — Telephone Encounter (Signed)
Hospital Follow-up call made to patient at this time. Spoke with Diana Meadows the patient's daughter. Hospital Follow-up interview questions below.  1. How are you feeling? Daughter stated that she expressed to her that she was doing well  2. Is your pain controlled? Daughter stated that her mother has not mentioned that she is any pain  3. What are you doing for the pain? n/a  4. Are you having any Nausea or Vomiting? Daughter stated that her mother has not experienced any nausea or vomiting.  5. Are you having any Fever or Chills? Daughter stated that her mother has not experienced any fever or chills.   6. Are you having any Constipation or Diarrhea? None at the moment had some diarrhea when she was first discharged but it has gradually went away and now she is having normal BM.  7. Do you have any questions or concerns at this time? none   Discussion:

## 2018-01-21 ENCOUNTER — Other Ambulatory Visit
Admission: RE | Admit: 2018-01-21 | Discharge: 2018-01-21 | Disposition: A | Discharge status: Home / Self Care | Payer: Medicare Other | Source: Ambulatory Visit | Attending: Internal Medicine | Admitting: Internal Medicine

## 2018-01-21 DIAGNOSIS — R0602 Shortness of breath: Secondary | ICD-10-CM | POA: Insufficient documentation

## 2018-01-21 DIAGNOSIS — J439 Emphysema, unspecified: Secondary | ICD-10-CM | POA: Insufficient documentation

## 2018-01-21 LAB — FIBRIN DERIVATIVES D-DIMER (ARMC ONLY): FIBRIN DERIVATIVES D-DIMER (ARMC): 771.37 ng{FEU}/mL — AB (ref 0.00–499.00)

## 2018-01-22 ENCOUNTER — Ambulatory Visit
Admission: RE | Admit: 2018-01-22 | Discharge: 2018-01-22 | Disposition: A | Discharge status: Home / Self Care | Payer: Medicare Other | Source: Ambulatory Visit | Attending: Internal Medicine | Admitting: Internal Medicine

## 2018-01-22 ENCOUNTER — Other Ambulatory Visit: Payer: Self-pay | Admitting: Internal Medicine

## 2018-01-22 DIAGNOSIS — K449 Diaphragmatic hernia without obstruction or gangrene: Secondary | ICD-10-CM | POA: Diagnosis not present

## 2018-01-22 DIAGNOSIS — R7989 Other specified abnormal findings of blood chemistry: Secondary | ICD-10-CM

## 2018-01-22 DIAGNOSIS — J432 Centrilobular emphysema: Secondary | ICD-10-CM | POA: Insufficient documentation

## 2018-01-22 DIAGNOSIS — R918 Other nonspecific abnormal finding of lung field: Secondary | ICD-10-CM | POA: Diagnosis not present

## 2018-01-22 DIAGNOSIS — I700 Atherosclerosis of aorta: Secondary | ICD-10-CM | POA: Insufficient documentation

## 2018-01-22 LAB — POCT I-STAT CREATININE: CREATININE: 1.3 mg/dL — AB (ref 0.44–1.00)

## 2018-01-22 MED ORDER — IOPAMIDOL (ISOVUE-370) INJECTION 76%
75.0000 mL | Freq: Once | INTRAVENOUS | Status: AC | PRN
  Administered 2018-01-22: 60 mL via INTRAVENOUS

## 2018-03-10 ENCOUNTER — Emergency Department: Payer: Medicare Other

## 2018-03-10 ENCOUNTER — Emergency Department
Admission: EM | Admit: 2018-03-10 | Discharge: 2018-03-10 | Disposition: A | Discharge status: Home / Self Care | Payer: Medicare Other | Attending: Emergency Medicine | Admitting: Emergency Medicine

## 2018-03-10 DIAGNOSIS — Z87891 Personal history of nicotine dependence: Secondary | ICD-10-CM | POA: Diagnosis not present

## 2018-03-10 DIAGNOSIS — M545 Low back pain: Secondary | ICD-10-CM | POA: Diagnosis present

## 2018-03-10 DIAGNOSIS — M4856XA Collapsed vertebra, not elsewhere classified, lumbar region, initial encounter for fracture: Secondary | ICD-10-CM | POA: Diagnosis not present

## 2018-03-10 DIAGNOSIS — J449 Chronic obstructive pulmonary disease, unspecified: Secondary | ICD-10-CM | POA: Insufficient documentation

## 2018-03-10 LAB — COMPREHENSIVE METABOLIC PANEL
ALBUMIN: 3.3 g/dL — AB (ref 3.5–5.0)
ALT: 12 U/L (ref 0–44)
ANION GAP: 6 (ref 5–15)
AST: 25 U/L (ref 15–41)
Alkaline Phosphatase: 69 U/L (ref 38–126)
BUN: 15 mg/dL (ref 8–23)
CO2: 32 mmol/L (ref 22–32)
Calcium: 8.5 mg/dL — ABNORMAL LOW (ref 8.9–10.3)
Chloride: 100 mmol/L (ref 98–111)
Creatinine, Ser: 0.56 mg/dL (ref 0.44–1.00)
GFR calc Af Amer: >60 mL/min (ref >60)
GFR calc non Af Amer: >60 mL/min (ref >60)
GLUCOSE: 114 mg/dL — AB (ref 70–99)
POTASSIUM: 4 mmol/L (ref 3.5–5.1)
SODIUM: 138 mmol/L (ref 135–145)
Total Bilirubin: 0.4 mg/dL (ref 0.3–1.2)
Total Protein: 6.3 g/dL — ABNORMAL LOW (ref 6.5–8.1)

## 2018-03-10 LAB — CBC WITH DIFFERENTIAL/PLATELET
BASOS PCT: 1 %
Basophils Absolute: 0.1 10*3/uL (ref 0–0.1)
EOS ABS: 0.1 10*3/uL (ref 0–0.7)
Eosinophils Relative: 2 %
HCT: 40.2 % (ref 35.0–47.0)
HEMOGLOBIN: 13.6 g/dL (ref 12.0–16.0)
Lymphocytes Relative: 21 %
Lymphs Abs: 1.5 10*3/uL (ref 1.0–3.6)
MCH: 32.1 pg (ref 26.0–34.0)
MCHC: 33.8 g/dL (ref 32.0–36.0)
MCV: 95.1 fL (ref 80.0–100.0)
MONO ABS: 0.7 10*3/uL (ref 0.2–0.9)
MONOS PCT: 10 %
NEUTROS PCT: 66 %
Neutro Abs: 4.8 10*3/uL (ref 1.4–6.5)
Platelets: 190 10*3/uL (ref 150–440)
RBC: 4.23 MIL/uL (ref 3.80–5.20)
RDW: 13.1 % (ref 11.5–14.5)
WBC: 7.2 10*3/uL (ref 3.6–11.0)

## 2018-03-10 LAB — URINALYSIS, COMPLETE (UACMP) WITH MICROSCOPIC
BACTERIA UA: NONE SEEN
Bilirubin Urine: NEGATIVE
GLUCOSE, UA: NEGATIVE mg/dL
Hgb urine dipstick: NEGATIVE
Ketones, ur: NEGATIVE mg/dL
LEUKOCYTES UA: NEGATIVE
Nitrite: NEGATIVE
PH: 5 (ref 5.0–8.0)
Protein, ur: NEGATIVE mg/dL
Specific Gravity, Urine: 1.014 (ref 1.005–1.030)

## 2018-03-10 MED ORDER — DIAZEPAM 5 MG PO TABS
5.0000 mg | ORAL_TABLET | Freq: Once | ORAL | Status: AC
  Administered 2018-03-10: 5 mg via ORAL
  Filled 2018-03-10: qty 1

## 2018-03-10 MED ORDER — DIAZEPAM 5 MG PO TABS: 5.0000 mg | ORAL_TABLET | Freq: Three times a day (TID) | ORAL | 0 refills | Status: DC | PRN

## 2018-03-10 MED ORDER — OXYCODONE-ACETAMINOPHEN 5-325 MG PO TABS
2.0000 | ORAL_TABLET | Freq: Once | ORAL | Status: DC
  Filled 2018-03-10: qty 2

## 2018-03-10 MED ORDER — ONDANSETRON 4 MG PO TBDP: 4.0000 mg | ORAL_TABLET | Freq: Three times a day (TID) | ORAL | 0 refills | Status: DC | PRN

## 2018-03-10 MED ORDER — OXYCODONE-ACETAMINOPHEN 5-325 MG PO TABS
1.0000 | ORAL_TABLET | Freq: Once | ORAL | Status: AC
  Administered 2018-03-10: 1 via ORAL

## 2018-03-10 MED ORDER — ONDANSETRON 4 MG PO TBDP
ORAL_TABLET | ORAL | Status: AC
  Administered 2018-03-10: 4 mg via ORAL
  Filled 2018-03-10: qty 1

## 2018-03-10 MED ORDER — OXYCODONE-ACETAMINOPHEN 7.5-325 MG PO TABS: 1.0000 | ORAL_TABLET | ORAL | 0 refills | Status: DC | PRN

## 2018-03-10 MED ORDER — ONDANSETRON 4 MG PO TBDP
4.0000 mg | ORAL_TABLET | Freq: Once | ORAL | Status: AC
  Administered 2018-03-10: 4 mg via ORAL

## 2018-03-10 NOTE — ED Notes (Signed)
Spoke with lab over inability to print labels from 3 different computers. Tammy, medic collected blood and Chart labels placed on tubes after verification of correct information.

## 2018-03-10 NOTE — ED Triage Notes (Addendum)
PT [place] with complaints of [place] pain. PT was seen at urgent care yesterday and compression fractures were diagnosed. PT was prescribed pain medication and took one last night that allowed her to sleep all night. Pt woke up with back pain that pt reports has debilitated her ability to perform ADLs.

## 2018-03-10 NOTE — ED Provider Notes (Signed)
[facility] Emergency Department Provider Note       Time seen: ----------------------------------------- 11:26 AM on 03/10/2018 -----------------------------------------   I have reviewed the triage vital signs and the nursing notes.  HISTORY   Chief Complaint Back Pain    HPI Diana Meadows is a 84 y.o. female with a history of COPD, depression, GERD, small bowel obstruction who presents to the ED for low back pain.  Patient presents from [place] with complaints of low back pain.  She was seen at an urgent care yesterday was diagnosed with some compression fractures.  She has been taking hydrocodone and took one last night and allowed her to sleep all night.  She woke with back pain this morning and has decreased ability to perform normal daily activities.  Past Medical History:  Diagnosis Date  . Bowel obstruction (HCC)   . COPD (chronic obstructive pulmonary disease) (HCC)   . Depression   . GERD (gastroesophageal reflux disease)     Patient Active Problem List   Diagnosis Date Noted  . Small bowel obstruction (HCC) 01/29/2017  . COPD (chronic obstructive pulmonary disease) (HCC) 09/14/2016  . GERD (gastroesophageal reflux disease) 09/14/2016  . Depression 09/14/2016  . CAP (community acquired pneumonia) 09/14/2016    Past Surgical History:  Procedure Laterality Date  . ABDOMINAL HYSTERECTOMY    . CESAREAN SECTION     x 3  . CHOLECYSTECTOMY    . HERNIA REPAIR      Allergies Codeine and Nsaids  Social History Social History   Tobacco Use  . Smoking status: Former Games developermoker  . Smokeless tobacco: Never Used  Substance Use Topics  . Alcohol use: Yes    Comment: occ  . Drug use: Not on file    Review of Systems Constitutional: Negative for fever. Cardiovascular: Negative for chest pain. Respiratory: Negative for shortness of breath. Gastrointestinal: Negative for abdominal pain, vomiting and diarrhea. Musculoskeletal: Positive  for back pain Skin: Negative for rash. Neurological: Negative for headaches, focal weakness or numbness.  All systems negative/normal/unremarkable except as stated in the HPI  ____________________________________________   PHYSICAL EXAM:  VITAL SIGNS: ED Triage Vitals  Enc Vitals Group     BP 03/10/18 1119 (!) 139/93     Pulse Rate 03/10/18 1119 95     Resp 03/10/18 1119 20     Temp 03/10/18 1119 97.8 F (36.6 C)     Temp Source 03/10/18 1119 Oral     SpO2 03/10/18 1119 (!) 89 %     Weight 03/10/18 1116 150 lb (68 kg)     Height 03/10/18 1116 5\' 4"  (1.626 m)     Head Circumference --      Peak Flow --      Pain Score 03/10/18 1120 10     Pain Loc --      Pain Edu? --      Excl. in GC? --    Constitutional: Alert and oriented.  Mild distress Eyes: Conjunctivae are normal. Normal extraocular movements. ENT   Head: Normocephalic and atraumatic.   Nose: No congestion/rhinnorhea.   Mouth/Throat: Mucous membranes are moist.   Neck: No stridor. Cardiovascular: Normal rate, regular rhythm. No murmurs, rubs, or gallops. Respiratory: Normal respiratory effort without tachypnea nor retractions. Breath sounds are clear and equal bilaterally. No wheezes/rales/rhonchi. Gastrointestinal: Soft and nontender. Normal bowel sounds Musculoskeletal: Nontender with normal range of motion in extremities. No lower extremity tenderness nor edema.  Negative cross and straight leg raise examination.  Pinpoint  lumbar spine tenderness Neurologic:  Normal speech and language. No gross focal neurologic deficits are appreciated.  Skin:  Skin is warm, dry and intact. No rash noted. Psychiatric: Mood and affect are normal. Speech and behavior are normal.  ____________________________________________  ED COURSE:  As part of my medical decision making, I reviewed the following data within the electronic MEDICAL RECORD NUMBER History obtained from family if available, nursing notes, old chart and  ekg, as well as notes from prior ED visits. Patient presented for low back pain, we will assess with labs and imaging as indicated at this time.   Procedures ____________________________________________   LABS (pertinent positives/negatives)  Labs Reviewed  COMPREHENSIVE METABOLIC PANEL - Abnormal; Notable for the following components:      Result Value   Glucose, Bld 114 (*)    Calcium 8.5 (*)    Total Protein 6.3 (*)    Albumin 3.3 (*)    All other components within normal limits  URINALYSIS, COMPLETE (UACMP) WITH MICROSCOPIC - Abnormal; Notable for the following components:   Color, Urine YELLOW (*)    APPearance CLEAR (*)    All other components within normal limits  CBC WITH DIFFERENTIAL/PLATELET    RADIOLOGY Images were viewed by me  lumbar spine x-ray IMPRESSION: Compression deformities at L2 and L3 of uncertain chronicity but new from the prior exam of 01/29/2017. Nonemergent MRI may be helpful to assess for chronicity as clinically indicated.  ____________________________________________  DIFFERENTIAL DIAGNOSIS   Compression fracture, spasm, degenerative disc disease, sciatica  FINAL ASSESSMENT AND PLAN  Low back pain, compression fracture   Plan: The patient had presented for low back pain. Patient's labs did not reveal any acute process. Patient's imaging did reveal compression fractures of L2 and L3.  Pain was reasonably controlled here.  She will be referred to orthopedics for close outpatient follow-up.   Ulice Dash, MD   Note: This note was generated in part or whole with voice recognition software. Voice recognition is usually quite accurate but there are transcription errors that can and very often do occur. I apologize for any typographical errors that were not detected and corrected.     Emily Filbert, MD 03/10/18 [phone]

## 2018-03-10 NOTE — ED Notes (Addendum)
Patient transported to X-ray 

## 2018-03-18 ENCOUNTER — Other Ambulatory Visit: Payer: Self-pay | Admitting: Orthopedic Surgery

## 2018-03-18 DIAGNOSIS — S32020A Wedge compression fracture of second lumbar vertebra, initial encounter for closed fracture: Secondary | ICD-10-CM

## 2018-03-20 ENCOUNTER — Other Ambulatory Visit: Payer: Self-pay | Admitting: Orthopedic Surgery

## 2018-03-20 DIAGNOSIS — S32020A Wedge compression fracture of second lumbar vertebra, initial encounter for closed fracture: Secondary | ICD-10-CM

## 2018-03-24 ENCOUNTER — Ambulatory Visit
Admission: RE | Admit: 2018-03-24 | Discharge: 2018-03-24 | Disposition: A | Discharge status: Home / Self Care | Payer: Medicare Other | Source: Ambulatory Visit | Attending: Orthopedic Surgery | Admitting: Orthopedic Surgery

## 2018-03-24 DIAGNOSIS — I6782 Cerebral ischemia: Secondary | ICD-10-CM | POA: Diagnosis not present

## 2018-03-24 DIAGNOSIS — M4856XA Collapsed vertebra, not elsewhere classified, lumbar region, initial encounter for fracture: Secondary | ICD-10-CM | POA: Diagnosis not present

## 2018-03-24 DIAGNOSIS — S32020A Wedge compression fracture of second lumbar vertebra, initial encounter for closed fracture: Secondary | ICD-10-CM

## 2018-03-24 DIAGNOSIS — R29890 Loss of height: Secondary | ICD-10-CM | POA: Insufficient documentation

## 2018-03-24 DIAGNOSIS — M2548 Effusion, other site: Secondary | ICD-10-CM | POA: Insufficient documentation

## 2018-03-26 ENCOUNTER — Other Ambulatory Visit: Payer: Self-pay

## 2018-03-26 ENCOUNTER — Encounter
Admission: RE | Admit: 2018-03-26 | Discharge: 2018-03-26 | Disposition: A | Discharge status: Home / Self Care | Payer: Medicare Other | Source: Ambulatory Visit | Attending: Orthopedic Surgery | Admitting: Orthopedic Surgery

## 2018-03-26 DIAGNOSIS — F329 Major depressive disorder, single episode, unspecified: Secondary | ICD-10-CM | POA: Diagnosis not present

## 2018-03-26 DIAGNOSIS — Z9981 Dependence on supplemental oxygen: Secondary | ICD-10-CM | POA: Diagnosis not present

## 2018-03-26 DIAGNOSIS — Z87891 Personal history of nicotine dependence: Secondary | ICD-10-CM | POA: Diagnosis not present

## 2018-03-26 DIAGNOSIS — J449 Chronic obstructive pulmonary disease, unspecified: Secondary | ICD-10-CM | POA: Diagnosis not present

## 2018-03-26 DIAGNOSIS — I10 Essential (primary) hypertension: Secondary | ICD-10-CM | POA: Diagnosis not present

## 2018-03-26 DIAGNOSIS — K219 Gastro-esophageal reflux disease without esophagitis: Secondary | ICD-10-CM | POA: Diagnosis not present

## 2018-03-26 DIAGNOSIS — M4856XA Collapsed vertebra, not elsewhere classified, lumbar region, initial encounter for fracture: Secondary | ICD-10-CM | POA: Diagnosis present

## 2018-03-26 DIAGNOSIS — Z79899 Other long term (current) drug therapy: Secondary | ICD-10-CM | POA: Diagnosis not present

## 2018-03-26 HISTORY — DX: Unspecified asthma, uncomplicated: J45.909

## 2018-03-26 HISTORY — DX: Cortical age-related cataract, unspecified eye: H25.019

## 2018-03-26 HISTORY — DX: Other constipation: K59.09

## 2018-03-26 HISTORY — DX: Age-related osteoporosis without current pathological fracture: M81.0

## 2018-03-26 HISTORY — DX: Acute pancreatitis without necrosis or infection, unspecified: K85.90

## 2018-03-26 HISTORY — DX: Unspecified osteoarthritis, unspecified site: M19.90

## 2018-03-26 HISTORY — DX: Unspecified abdominal hernia without obstruction or gangrene: K46.9

## 2018-03-26 HISTORY — DX: Calculus of gallbladder without cholecystitis without obstruction: K80.20

## 2018-03-26 HISTORY — DX: Polyp of colon: K63.5

## 2018-03-26 HISTORY — DX: Essential (primary) hypertension: I10

## 2018-03-26 NOTE — Patient Instructions (Addendum)
Your procedure is scheduled on: Friday, March 28, 2018 Report [facility] To find out your arrival time, please call [phone] between 1PM - 3PM on: Thursday, March 27, 2018  REMEMBER: Instructions that are not followed completely may result in serious medical risk, up to and including death; or upon the discretion of your surgeon and anesthesiologist your surgery may need to be rescheduled.  Do not eat food after midnight the night before your procedure.  No gum chewing, lozengers or hard candies.  You may however, drink CLEAR liquids up to 2 hours before you are scheduled to arrive for your surgery. Do not drink anything within 2 hours of the start of your surgery.  Clear liquids include: - water  - apple juice without pulp - clear gatorade - black coffee or tea (Do NOT add anything to the coffee or tea) Do NOT drink anything that is not on this list.  No Alcohol for 24 hours before or after surgery.  No Smoking including e-cigarettes for 24 hours prior to surgery.  No chewable tobacco products for at least 6 hours prior to surgery.  No nicotine patches on the day of surgery.  On the morning of surgery brush your teeth with toothpaste and water, you may rinse your mouth with mouthwash if you wish. Do not swallow any toothpaste or mouthwash.  Notify your doctor if there is any change in your medical condition (cold, fever, infection).  Do not wear jewelry, make-up, hairpins, clips or nail polish.  Do not wear lotions, powders, or perfumes. You may wear deodorant.  Do not shave 48 hours prior to surgery.   Contacts and dentures may not be worn into surgery.  Do not bring valuables to the hospital, including drivers license, insurance or credit cards.   is not responsible for any belongings or valuables.   TAKE THESE MEDICATIONS THE MORNING OF SURGERY:  1.  Albuterol inhaler 2.  Estradiol 3.  cyclobenzaprine (as  needed)  Use CHG Soap or wipes as directed on instruction sheet.  Use inhalers on the day of surgery and bring to the hospital.  NOW!  Stop Anti-inflammatories (NSAIDS) such as Advil, Aleve, Ibuprofen, Motrin, Naproxen, Naprosyn and Aspirin based products such as Excedrin, Goodys Powder, BC Powder. (May take Tylenol or Acetaminophen if needed.)  NOW!  Stop ANY OVER THE COUNTER supplements until after surgery.  Wear comfortable clothing (specific to your surgery type) to the hospital.  Plan for stool softeners for home use.  If you are being discharged the day of surgery, you will not be allowed to drive home. You will need a responsible adult to drive you home and stay with you that night.   Please call [phone] if you have any questions about these instructions.

## 2018-03-27 MED ORDER — CEFAZOLIN SODIUM-DEXTROSE 2-4 GM/100ML-% IV SOLN
2.0000 g | Freq: Once | INTRAVENOUS | Status: AC
  Administered 2018-03-28: 2 g via INTRAVENOUS

## 2018-03-28 ENCOUNTER — Ambulatory Visit: Payer: Medicare Other

## 2018-03-28 ENCOUNTER — Ambulatory Visit: Payer: Medicare Other | Admitting: Anesthesiology

## 2018-03-28 ENCOUNTER — Encounter: Payer: Self-pay | Admitting: Emergency Medicine

## 2018-03-28 ENCOUNTER — Other Ambulatory Visit: Payer: Self-pay

## 2018-03-28 ENCOUNTER — Observation Stay
Admission: RE | Admit: 2018-03-28 | Discharge: 2018-03-29 | Disposition: A | Discharge status: Nursing Home (Includes ICF) | Payer: Medicare Other | Source: Ambulatory Visit | Attending: Orthopedic Surgery | Admitting: Orthopedic Surgery

## 2018-03-28 ENCOUNTER — Encounter
Admission: RE | Disposition: A | Discharge status: Nursing Home (Includes ICF) | Payer: Self-pay | Source: Ambulatory Visit | Attending: Orthopedic Surgery

## 2018-03-28 DIAGNOSIS — Z79899 Other long term (current) drug therapy: Secondary | ICD-10-CM | POA: Insufficient documentation

## 2018-03-28 DIAGNOSIS — J449 Chronic obstructive pulmonary disease, unspecified: Secondary | ICD-10-CM | POA: Insufficient documentation

## 2018-03-28 DIAGNOSIS — K219 Gastro-esophageal reflux disease without esophagitis: Secondary | ICD-10-CM | POA: Insufficient documentation

## 2018-03-28 DIAGNOSIS — Z9981 Dependence on supplemental oxygen: Secondary | ICD-10-CM | POA: Insufficient documentation

## 2018-03-28 DIAGNOSIS — M4856XA Collapsed vertebra, not elsewhere classified, lumbar region, initial encounter for fracture: Principal | ICD-10-CM | POA: Insufficient documentation

## 2018-03-28 DIAGNOSIS — F329 Major depressive disorder, single episode, unspecified: Secondary | ICD-10-CM | POA: Insufficient documentation

## 2018-03-28 DIAGNOSIS — Z9889 Other specified postprocedural states: Secondary | ICD-10-CM

## 2018-03-28 DIAGNOSIS — I10 Essential (primary) hypertension: Secondary | ICD-10-CM | POA: Insufficient documentation

## 2018-03-28 DIAGNOSIS — Z419 Encounter for procedure for purposes other than remedying health state, unspecified: Secondary | ICD-10-CM

## 2018-03-28 DIAGNOSIS — Z87891 Personal history of nicotine dependence: Secondary | ICD-10-CM | POA: Insufficient documentation

## 2018-03-28 HISTORY — PX: KYPHOPLASTY: SHX5884

## 2018-03-28 SURGERY — KYPHOPLASTY
Anesthesia: General | Site: Back | Wound class: Clean

## 2018-03-28 MED ORDER — LIDOCAINE HCL 1 % IJ SOLN
INTRAMUSCULAR | Status: DC | PRN
  Administered 2018-03-28: 30 mL

## 2018-03-28 MED ORDER — BUPIVACAINE-EPINEPHRINE (PF) 0.5% -1:200000 IJ SOLN
INTRAMUSCULAR | Status: AC
  Filled 2018-03-28: qty 30

## 2018-03-28 MED ORDER — FUROSEMIDE 20 MG PO TABS
20.0000 mg | ORAL_TABLET | Freq: Every day | ORAL | Status: DC
  Administered 2018-03-28 – 2018-03-29 (×2): 20 mg via ORAL
  Filled 2018-03-28 (×2): qty 1

## 2018-03-28 MED ORDER — MAGNESIUM 250 MG PO TABS: 1.0000 | ORAL_TABLET | Freq: Every day | ORAL | Status: DC

## 2018-03-28 MED ORDER — LIDOCAINE HCL (PF) 2 % IJ SOLN
INTRAMUSCULAR | Status: AC
  Filled 2018-03-28: qty 10

## 2018-03-28 MED ORDER — VITAMIN D 1000 UNITS PO TABS
1000.0000 [IU] | ORAL_TABLET | Freq: Every day | ORAL | Status: DC
  Administered 2018-03-28 – 2018-03-29 (×2): 1000 [IU] via ORAL
  Filled 2018-03-28 (×2): qty 1

## 2018-03-28 MED ORDER — ACETAMINOPHEN 500 MG PO TABS
1000.0000 mg | ORAL_TABLET | Freq: Two times a day (BID) | ORAL | Status: DC
  Administered 2018-03-28 – 2018-03-29 (×2): 1000 mg via ORAL
  Filled 2018-03-28 (×4): qty 2

## 2018-03-28 MED ORDER — LIDOCAINE HCL (CARDIAC) PF 100 MG/5ML IV SOSY
PREFILLED_SYRINGE | INTRAVENOUS | Status: DC | PRN
  Administered 2018-03-28: 60 mg via INTRAVENOUS

## 2018-03-28 MED ORDER — MAGNESIUM OXIDE 400 (241.3 MG) MG PO TABS
200.0000 mg | ORAL_TABLET | Freq: Every day | ORAL | Status: DC
  Administered 2018-03-29: 200 mg via ORAL
  Filled 2018-03-28: qty 1

## 2018-03-28 MED ORDER — CEFAZOLIN SODIUM-DEXTROSE 2-4 GM/100ML-% IV SOLN
INTRAVENOUS | Status: AC
  Filled 2018-03-28: qty 100

## 2018-03-28 MED ORDER — ONDANSETRON HCL 4 MG PO TABS: 4.0000 mg | ORAL_TABLET | Freq: Four times a day (QID) | ORAL | Status: DC | PRN

## 2018-03-28 MED ORDER — BUPIVACAINE-EPINEPHRINE (PF) 0.5% -1:200000 IJ SOLN
INTRAMUSCULAR | Status: DC | PRN
  Administered 2018-03-28: 20 mL via PERINEURAL

## 2018-03-28 MED ORDER — ONDANSETRON HCL 4 MG/2ML IJ SOLN: 4.0000 mg | Freq: Once | INTRAMUSCULAR | Status: DC | PRN

## 2018-03-28 MED ORDER — BISACODYL 5 MG PO TBEC: 5.0000 mg | DELAYED_RELEASE_TABLET | Freq: Every day | ORAL | Status: DC | PRN

## 2018-03-28 MED ORDER — MORPHINE SULFATE (PF) 4 MG/ML IV SOLN
2.0000 mg | INTRAVENOUS | Status: DC | PRN
  Administered 2018-03-28: 2 mg via INTRAVENOUS
  Filled 2018-03-28: qty 1

## 2018-03-28 MED ORDER — SODIUM CHLORIDE 0.9 % IV SOLN
INTRAVENOUS | Status: DC
  Administered 2018-03-28 – 2018-03-29 (×2): via INTRAVENOUS

## 2018-03-28 MED ORDER — FAMOTIDINE 20 MG PO TABS
ORAL_TABLET | ORAL | Status: AC
  Filled 2018-03-28: qty 1

## 2018-03-28 MED ORDER — HYDROCODONE-ACETAMINOPHEN 5-325 MG PO TABS: 1.0000 | ORAL_TABLET | ORAL | Status: DC | PRN

## 2018-03-28 MED ORDER — IOPAMIDOL (ISOVUE-M 200) INJECTION 41%
INTRAMUSCULAR | Status: DC | PRN
  Administered 2018-03-28: 20 mL

## 2018-03-28 MED ORDER — ONDANSETRON HCL 4 MG/2ML IJ SOLN: 4.0000 mg | Freq: Four times a day (QID) | INTRAMUSCULAR | Status: DC | PRN

## 2018-03-28 MED ORDER — ACETAMINOPHEN 650 MG RE SUPP: 650.0000 mg | Freq: Four times a day (QID) | RECTAL | Status: DC | PRN

## 2018-03-28 MED ORDER — HYDROCODONE-ACETAMINOPHEN 5-325 MG PO TABS
1.0000 | ORAL_TABLET | ORAL | Status: DC | PRN
  Filled 2018-03-28: qty 1

## 2018-03-28 MED ORDER — HYDROCODONE-ACETAMINOPHEN 5-325 MG PO TABS: 1.0000 | ORAL_TABLET | Freq: Four times a day (QID) | ORAL | 0 refills | Status: DC | PRN

## 2018-03-28 MED ORDER — ACETAMINOPHEN 325 MG PO TABS
650.0000 mg | ORAL_TABLET | Freq: Four times a day (QID) | ORAL | Status: DC | PRN
  Administered 2018-03-28 – 2018-03-29 (×2): 650 mg via ORAL
  Filled 2018-03-28 (×2): qty 2

## 2018-03-28 MED ORDER — FLUTICASONE PROPIONATE 50 MCG/ACT NA SUSP
2.0000 | Freq: Every day | NASAL | Status: DC | PRN
  Filled 2018-03-28: qty 16

## 2018-03-28 MED ORDER — PROPOFOL 500 MG/50ML IV EMUL
INTRAVENOUS | Status: AC
  Filled 2018-03-28: qty 50

## 2018-03-28 MED ORDER — DOCUSATE SODIUM 100 MG PO CAPS
100.0000 mg | ORAL_CAPSULE | Freq: Two times a day (BID) | ORAL | Status: DC
  Administered 2018-03-28 – 2018-03-29 (×3): 100 mg via ORAL
  Filled 2018-03-28 (×4): qty 1

## 2018-03-28 MED ORDER — CYCLOBENZAPRINE HCL 10 MG PO TABS: 5.0000 mg | ORAL_TABLET | Freq: Every evening | ORAL | Status: DC | PRN

## 2018-03-28 MED ORDER — MAGNESIUM CITRATE PO SOLN
1.0000 | Freq: Once | ORAL | Status: DC | PRN
  Filled 2018-03-28: qty 296

## 2018-03-28 MED ORDER — KETOROLAC TROMETHAMINE 15 MG/ML IJ SOLN
15.0000 mg | Freq: Once | INTRAMUSCULAR | Status: AC
  Administered 2018-03-28: 15 mg via INTRAVENOUS

## 2018-03-28 MED ORDER — IOPAMIDOL (ISOVUE-M 200) INJECTION 41%
INTRAMUSCULAR | Status: AC
  Filled 2018-03-28: qty 10

## 2018-03-28 MED ORDER — POLYETHYLENE GLYCOL 3350 17 G PO PACK: 17.0000 g | PACK | Freq: Every day | ORAL | Status: DC | PRN

## 2018-03-28 MED ORDER — PROPOFOL 500 MG/50ML IV EMUL
INTRAVENOUS | Status: DC | PRN
  Administered 2018-03-28: 50 ug/kg/min via INTRAVENOUS

## 2018-03-28 MED ORDER — ALBUTEROL SULFATE (2.5 MG/3ML) 0.083% IN NEBU: 3.0000 mL | INHALATION_SOLUTION | Freq: Four times a day (QID) | RESPIRATORY_TRACT | Status: DC | PRN

## 2018-03-28 MED ORDER — FENTANYL CITRATE (PF) 100 MCG/2ML IJ SOLN
INTRAMUSCULAR | Status: AC
  Administered 2018-03-28: 25 ug via INTRAVENOUS
  Filled 2018-03-28: qty 2

## 2018-03-28 MED ORDER — KETOROLAC TROMETHAMINE 15 MG/ML IJ SOLN
INTRAMUSCULAR | Status: AC
  Administered 2018-03-28: 15 mg via INTRAVENOUS
  Filled 2018-03-28: qty 1

## 2018-03-28 MED ORDER — FENTANYL CITRATE (PF) 100 MCG/2ML IJ SOLN
25.0000 ug | INTRAMUSCULAR | Status: AC | PRN
  Administered 2018-03-28 (×6): 25 ug via INTRAVENOUS

## 2018-03-28 MED ORDER — ZOLPIDEM TARTRATE 5 MG PO TABS: 5.0000 mg | ORAL_TABLET | Freq: Every evening | ORAL | Status: DC | PRN

## 2018-03-28 MED ORDER — MAGNESIUM HYDROXIDE 400 MG/5ML PO SUSP: 30.0000 mL | Freq: Every day | ORAL | Status: DC | PRN

## 2018-03-28 MED ORDER — FENTANYL CITRATE (PF) 100 MCG/2ML IJ SOLN
INTRAMUSCULAR | Status: AC
  Filled 2018-03-28: qty 2

## 2018-03-28 MED ORDER — KETOROLAC TROMETHAMINE 15 MG/ML IJ SOLN
15.0000 mg | Freq: Once | INTRAMUSCULAR | Status: DC
  Filled 2018-03-28: qty 1

## 2018-03-28 MED ORDER — PROPOFOL 10 MG/ML IV BOLUS
INTRAVENOUS | Status: AC
  Filled 2018-03-28: qty 20

## 2018-03-28 MED ORDER — METOCLOPRAMIDE HCL 5 MG/ML IJ SOLN: 5.0000 mg | Freq: Three times a day (TID) | INTRAMUSCULAR | Status: DC | PRN

## 2018-03-28 MED ORDER — LIDOCAINE HCL (PF) 1 % IJ SOLN
INTRAMUSCULAR | Status: AC
  Filled 2018-03-28: qty 30

## 2018-03-28 MED ORDER — PROPOFOL 10 MG/ML IV BOLUS
INTRAVENOUS | Status: DC | PRN
  Administered 2018-03-28 (×3): 20 mg via INTRAVENOUS

## 2018-03-28 MED ORDER — FAMOTIDINE 20 MG PO TABS
20.0000 mg | ORAL_TABLET | Freq: Once | ORAL | Status: AC
  Administered 2018-03-28: 20 mg via ORAL

## 2018-03-28 MED ORDER — LACTATED RINGERS IV SOLN
INTRAVENOUS | Status: DC
  Administered 2018-03-28: 10:00:00 via INTRAVENOUS

## 2018-03-28 MED ORDER — ESTRADIOL 1 MG PO TABS
0.5000 mg | ORAL_TABLET | Freq: Every day | ORAL | Status: DC
  Administered 2018-03-28 – 2018-03-29 (×2): 0.5 mg via ORAL
  Filled 2018-03-28 (×2): qty 0.5

## 2018-03-28 MED ORDER — METOCLOPRAMIDE HCL 10 MG PO TABS: 5.0000 mg | ORAL_TABLET | Freq: Three times a day (TID) | ORAL | Status: DC | PRN

## 2018-03-28 SURGICAL SUPPLY — 16 items
CEMENT KYPHON CX01A KIT/MIXER (Cement) ×3 IMPLANT
DERMABOND ADVANCED (GAUZE/BANDAGES/DRESSINGS) ×2
DERMABOND ADVANCED .7 DNX12 (GAUZE/BANDAGES/DRESSINGS) ×1 IMPLANT
DEVICE BIOPSY BONE KYPHX (INSTRUMENTS) ×3 IMPLANT
DRAPE C-ARM XRAY 36X54 (DRAPES) ×3 IMPLANT
DURAPREP 26ML APPLICATOR (WOUND CARE) ×3 IMPLANT
GLOVE SURG SYN 9.0  PF PI (GLOVE) ×2
GLOVE SURG SYN 9.0 PF PI (GLOVE) ×1 IMPLANT
GOWN SRG 2XL LVL 4 RGLN SLV (GOWNS) ×1 IMPLANT
GOWN STRL NON-REIN 2XL LVL4 (GOWNS) ×2
GOWN STRL REUS W/ TWL LRG LVL3 (GOWN DISPOSABLE) ×1 IMPLANT
GOWN STRL REUS W/TWL LRG LVL3 (GOWN DISPOSABLE) ×2
PACK KYPHOPLASTY (MISCELLANEOUS) ×3 IMPLANT
STRAP SAFETY 5IN WIDE (MISCELLANEOUS) ×3 IMPLANT
TRAY KYPHOPAK 15/3 EXPRESS 1ST (MISCELLANEOUS) IMPLANT
TRAY KYPHOPAK 20/3 EXPRESS 1ST (MISCELLANEOUS) ×3 IMPLANT

## 2018-03-28 NOTE — Anesthesia Preprocedure Evaluation (Signed)
Anesthesia Evaluation  Patient identified by MRN, date of birth, ID band Patient awake    Reviewed: Allergy & Precautions, NPO status , Patient's Chart, lab work & pertinent test results  History of Anesthesia Complications Negative for: history of anesthetic complications  Airway Mallampati: II  TM Distance: >3 FB Neck ROM: Full    Dental  (+) Upper Dentures, Lower Dentures   Pulmonary neg sleep apnea, COPD (2L O2 at night),  oxygen dependent, former smoker,    breath sounds clear to auscultation- rhonchi (-) wheezing      Cardiovascular hypertension, Pt. on medications (-) CAD, (-) Past MI, (-) Cardiac Stents and (-) CABG  Rhythm:Regular Rate:Normal - Systolic murmurs and - Diastolic murmurs    Neuro/Psych PSYCHIATRIC DISORDERS Depression negative neurological ROS     GI/Hepatic Neg liver ROS, GERD  ,  Endo/Other  negative endocrine ROSneg diabetes  Renal/GU negative Renal ROS     Musculoskeletal  (+) Arthritis ,   Abdominal (+) - obese,   Peds  Hematology negative hematology ROS (+)   Anesthesia Other Findings Past Medical History: 2013: Acute pancreatitis No date: Arthritis No date: Asthma No date: Bowel obstruction (HCC) No date: Cataract cortical, senile No date: Cholelithiasis without obstruction No date: Chronic constipation No date: Colon polyp No date: COPD (chronic obstructive pulmonary disease) (HCC) No date: Depression No date: GERD (gastroesophageal reflux disease) No date: Hernia, abdominal No date: Hypertension No date: Osteoporosis   Reproductive/Obstetrics                             Anesthesia Physical Anesthesia Plan  ASA: III  Anesthesia Plan: General   Post-op Pain Management:    Induction: Intravenous  PONV Risk Score and Plan: 2 and Propofol infusion  Airway Management Planned: Natural Airway  Additional Equipment:   Intra-op Plan:    Post-operative Plan:   Informed Consent: I have reviewed the patients History and Physical, chart, labs and discussed the procedure including the risks, benefits and alternatives for the proposed anesthesia with the patient or authorized representative who has indicated his/her understanding and acceptance.   Dental advisory given  Plan Discussed with: CRNA and Anesthesiologist  Anesthesia Plan Comments:         Anesthesia Quick Evaluation

## 2018-03-28 NOTE — Anesthesia Postprocedure Evaluation (Signed)
Anesthesia Post Note  Patient: Diana HongPearl V Knoblock  Procedure(s) Performed: Vevelyn RoyalsKYPHOPLASTY-L3 (N/A Back)  Patient location during evaluation: PACU Anesthesia Type: General Level of consciousness: awake and alert and oriented Pain management: pain level controlled Vital Signs Assessment: post-procedure vital signs reviewed and stable Respiratory status: spontaneous breathing, nonlabored ventilation and respiratory function stable Cardiovascular status: blood pressure returned to baseline and stable Postop Assessment: no signs of nausea or vomiting Anesthetic complications: no     Last Vitals:  Vitals:   03/28/18 1247 03/28/18 1302  BP: 132/63   Pulse:  87  Resp: (!) 31 19  Temp: 36.4 C   SpO2: 96% 90%    Last Pain:  Vitals:   03/28/18 1345  TempSrc:   PainSc: 6                  [name]

## 2018-03-28 NOTE — NC FL2 (Addendum)
[place] MEDICAID [id number] LEVEL OF CARE SCREENING TOOL     IDENTIFICATION  Patient Name: Diana Meadows Birthdate: Aug 30, 1919 Sex: female Admission Date (Current Location): 03/28/2018  [facility] and [place] Number:  Chiropodist and Address:  [facility], [address], [place], [place] [address]      Provider Number: [id number]  Attending Physician Name and Address:  Kennedy Bucker, MD  Relative Name and Phone Number:       Current Level of Care: Hospital Recommended Level of Care: Assisted Living Facility Prior Approval Number:    Date Approved/Denied:   PASRR Number:    Discharge Plan: Domiciliary (Rest home)    Current Diagnoses: Patient Active Problem List   Diagnosis Date Noted  . Status post kyphoplasty 03/28/2018  . Small bowel obstruction (HCC) 01/29/2017  . COPD (chronic obstructive pulmonary disease) (HCC) 09/14/2016  . GERD (gastroesophageal reflux disease) 09/14/2016  . Depression 09/14/2016  . CAP (community acquired pneumonia) 09/14/2016    Orientation RESPIRATION BLADDER Height & Weight     Self, Time, Situation, Place  O2(2 Liters Oxygen at night. ) Continent Weight: 164 lb (74.4 kg) Height:  5\' 4"  (162.6 cm)  BEHAVIORAL SYMPTOMS/MOOD NEUROLOGICAL BOWEL NUTRITION STATUS      Continent Diet(Diet: Regular )  AMBULATORY STATUS COMMUNICATION OF NEEDS Skin   Limited Assist Verbally Surgical wounds(Incision: Back. )                       Personal Care Assistance Level of Assistance  Bathing, Feeding, Dressing Bathing Assistance: Limited assistance Feeding assistance: Independent Dressing Assistance: Limited assistance     Functional Limitations Info  Sight, Hearing, Speech Sight Info: Adequate Hearing Info: Adequate Speech Info: Adequate    SPECIAL CARE FACTORS FREQUENCY  PT (By licensed PT)     PT Frequency: (2-3 home health )              Contractures      Additional Factors Info  Code  Status, Allergies Code Status Info: (Full Code. ) Allergies Info: (Codeine, Novocain Procaine, Nsaids)           Current Medications (03/28/2018):  This is the current hospital active medication list Current Facility-Administered Medications  Medication Dose Route Frequency Provider Last Rate Last Dose  . 0.9 %  sodium chloride infusion   Intravenous Continuous Kennedy Bucker, MD      . acetaminophen (TYLENOL) tablet 650 mg  650 mg Oral Q6H PRN Kennedy Bucker, MD       Or  . acetaminophen (TYLENOL) suppository 650 mg  650 mg Rectal Q6H PRN Kennedy Bucker, MD      . acetaminophen (TYLENOL) tablet 1,000 mg  1,000 mg Oral BID Kennedy Bucker, MD      . albuterol (PROVENTIL HFA;VENTOLIN HFA) 108 (90 Base) MCG/ACT inhaler 2 puff  2 puff Inhalation Q6H PRN Kennedy Bucker, MD      . bisacodyl (DULCOLAX) EC tablet 5 mg  5 mg Oral Daily PRN Kennedy Bucker, MD      . Cholecalciferol TBDP 1,000 Units  1,000 Units Oral Daily Kennedy Bucker, MD      . cyclobenzaprine (FLEXERIL) tablet 5 mg  5 mg Oral QHS PRN Kennedy Bucker, MD      . docusate sodium (COLACE) capsule 100 mg  100 mg Oral BID Kennedy Bucker, MD      . estradiol (ESTRACE) tablet 0.5 mg  0.5 mg Oral Daily Kennedy Bucker, MD      .  famotidine (PEPCID) 20 MG tablet           . fluticasone (FLONASE) 50 MCG/ACT nasal spray 2 spray  2 spray Each Nare Daily PRN Kennedy BuckerMenz, Michael, MD      . furosemide (LASIX) tablet 20 mg  20 mg Oral Daily Kennedy BuckerMenz, Michael, MD      . HYDROcodone-acetaminophen (NORCO/VICODIN) 5-325 MG per tablet 1 tablet  1 tablet Oral Q4H PRN Kennedy BuckerMenz, Michael, MD      . HYDROcodone-acetaminophen (NORCO/VICODIN) 5-325 MG per tablet 1-2 tablet  1-2 tablet Oral Q4H PRN Kennedy BuckerMenz, Michael, MD      . ketorolac (TORADOL) 15 MG/ML injection 15 mg  15 mg Intravenous Once Kennedy BuckerMenz, Michael, MD      . magnesium citrate solution 1 Bottle  1 Bottle Oral Once PRN Kennedy BuckerMenz, Michael, MD      . magnesium hydroxide (MILK OF MAGNESIA) suspension 30 mL  30 mL Oral Daily PRN Kennedy BuckerMenz,  Michael, MD      . Magnesium TABS 250 mg  1 tablet Oral Daily Kennedy BuckerMenz, Michael, MD      . metoCLOPramide (REGLAN) tablet 5-10 mg  5-10 mg Oral Q8H PRN Kennedy BuckerMenz, Michael, MD       Or  . metoCLOPramide (REGLAN) injection 5-10 mg  5-10 mg Intravenous Q8H PRN Kennedy BuckerMenz, Michael, MD      . morphine 4 MG/ML injection 2 mg  2 mg Intravenous Q2H PRN Kennedy BuckerMenz, Michael, MD      . ondansetron [facility](ZOFRAN) tablet 4 mg  4 mg Oral Q6H PRN Kennedy BuckerMenz, Michael, MD       Or  . ondansetron [facility](ZOFRAN) injection 4 mg  4 mg Intravenous Q6H PRN Kennedy BuckerMenz, Michael, MD      . ondansetron [facility](ZOFRAN) tablet 4 mg  4 mg Oral Q6H PRN Kennedy BuckerMenz, Michael, MD       Or  . ondansetron [facility](ZOFRAN) injection 4 mg  4 mg Intravenous Q6H PRN Kennedy BuckerMenz, Michael, MD      . polyethylene glycol (MIRALAX / GLYCOLAX) packet 17 g  17 g Oral Daily PRN Kennedy BuckerMenz, Michael, MD      . zolpidem (AMBIEN) tablet 5 mg  5 mg Oral QHS PRN Kennedy BuckerMenz, Michael, MD         Discharge Medications: Medication List    TAKE these medications   acetaminophen 500 MG tablet Commonly known as:  TYLENOL Take 1,000 mg by mouth 2 (two) times daily.   albuterol 108 (90 Base) MCG/ACT inhaler Commonly known as:  PROVENTIL HFA;VENTOLIN HFA Inhale 2 puffs into the lungs every 6 (six) hours as needed for wheezing or shortness of breath.   Cholecalciferol 1000 units Tbdp Take 1,000 Units by mouth daily.   cyclobenzaprine 5 MG tablet Commonly known as:  FLEXERIL Take 5 mg by mouth at bedtime as needed for muscle spasms.   estradiol 0.5 MG tablet Commonly known as:  ESTRACE Take 1 tablet by mouth daily.   fluticasone 50 MCG/ACT nasal spray Commonly known as:  FLONASE Place 2 sprays into both nostrils daily as needed for allergies.   furosemide 20 MG tablet Commonly known as:  LASIX Take 20 mg by mouth daily.   gentamicin ointment 0.1 % Commonly known as:  GARAMYCIN Apply 1 application topically 3 (three) times daily. For sore inside right nare   Magnesium 250 MG Tabs Take 1 tablet by mouth  daily.   OXYGEN Inhale 2 L into the lungs at bedtime.   polyethylene glycol packet Commonly known as:  MIRALAX / GLYCOLAX Take 17  g by mouth daily as needed for mild constipation.   traMADol 50 MG tablet Commonly known as:  ULTRAM Take 1-2 tablets (50-100 mg total) by mouth every 6 (six) hours as needed.      Relevant Imaging Results:  Relevant Lab Results:   Additional Information (SSN: [id number])  Sample, Darleen Crocker, LCSW

## 2018-03-28 NOTE — Care Management (Signed)
Patient placed in observation after an elective outpatient in bed procedure.  [facility].  heads up [facility].  Agency provides home health services to it's residents.

## 2018-03-28 NOTE — Care Management Obs Status (Signed)
MEDICARE OBSERVATION STATUS NOTIFICATION   Patient Details  Name: Diana Meadows MRN: [id number] Date of Birth: 1919-02-19   Medicare Observation Status Notification Given:  Notice left in room for patient's daughter to review per patient and grandson's request. It has not been signed   Eber HongGreene, Zacary Bauer R, RN 03/28/2018, 6:53 PM

## 2018-03-28 NOTE — OR Nursing (Signed)
Patient is up in the chair could tolerate position for a few min.

## 2018-03-28 NOTE — H&P (Signed)
Reviewed paper H+P, will be scanned into chart. No changes noted.  

## 2018-03-28 NOTE — Discharge Instructions (Signed)
Remove Band-Aid on Sunday and okay to shower after that.  Pain medicine as directed.  Take it easy today and tomorrow and resume more normal activities on Sunday

## 2018-03-28 NOTE — Care Management (Signed)
Spoke with patient and her grandson Mac regarding observation notice.  Both ask "why were we not told this before."  CM discussed that her procedure (which appears to have been elective) is an outpatient procedure and is not covered by medicare as inpatient.  Both ask that I call patient's daughter- Ms Glennon Mac.  The mobile number is not able to accept voicemail.  Informed patient and grandson of this and that this CM will have weekend CM follow up  tomorrow.  Printed the notice and left in the room for Ms Glennon Mac to review. Patient has chronic oxygen at [facility] that is provided by [facility].

## 2018-03-28 NOTE — Transfer of Care (Addendum)
Immediate Anesthesia Transfer of Care Note  Patient: Diana Meadows  Procedure(s) Performed: Vevelyn RoyalsKYPHOPLASTY-L3 (N/A Back)  Patient Location: PACU  Anesthesia Type:General  Level of Consciousness: awake, alert  and oriented  Airway & Oxygen Therapy: Patient Spontanous Breathing  Post-op Assessment: Report given to RN  Post vital signs: Reviewed and stable  Last Vitals:  Vitals Value Taken Time  BP 132/63 03/28/2018 12:47 PM  Temp 97.6 03/28/2018 12:47 PM  Pulse 94 03/28/2018 12:51 PM  Resp 20 03/28/2018 12:51 PM  SpO2 97 % 03/28/2018 12:51 PM  Vitals shown include unvalidated device data.  Last Pain:  Vitals:   03/28/18 0951  TempSrc: Oral  PainSc: 10-Worst pain ever         Complications: No apparent anesthesia complications

## 2018-03-28 NOTE — Clinical Social Work Note (Signed)
Clinical Social Work Assessment  Patient Details  Name: Diana Meadows MRN: [id number] Date of Birth: [birth year]  Date of referral:  03/28/18               Reason for consult:  Other (Comment Required)(From [facility] )                Permission sought to share information with:  Oceanographeracility Contact Representative Permission granted to share information::  Yes, Verbal Permission Granted  Name::      [facility]   Agency::     Relationship::     Contact Information:     Housing/Transportation Living arrangements for the past 2 months:  Assisted Living Facility Source of Information:  Adult Children, Facility Patient Interpreter Needed:  None Criminal Activity/Legal Involvement Pertinent to Current Situation/Hospitalization:  No - Comment as needed Significant Relationships:  Adult Children Lives with:  Facility Resident Do you feel safe going back to the place where you live?  Yes Need for family participation in patient care:  Yes (Comment)  Care giving concerns:  [facility] (fax: [phone]).    Social Worker assessment / plan:  Visual merchandiserClinical Social Worker (CSW) reviewed chart and noted that [facility]. [facility] to get more information. Per Diana Meadows patient is an ALF resident and is on chronic oxygen 2 Liters at night. Per Diana Meadows she has a Insurance underwriterconcentrator but not portable tanks. Per Diana Meadows patient has been in a wheel chair recently due to the compression fractures. Per Diana Meadows patient is a 1 person assistance in the wheel chair. Per Diana Meadows prior to compression fracture patient was independent with all her ADLS. CSW made Diana Meadows aware that patient is under observation and will likely [facility]. Per Diana Meadows patient can return [facility] when medically stable with home health PT. CSW contacted patient's daughter Diana Meadows and made her aware of above. Per Diana Meadows she will provide transport for  patient tomorrow. Diana Meadows is agreeable for patient to return [facility]. RN case manager aware of above. CSW will continue to follow and assist as needed.   Employment status:  Retired Health and safety inspectornsurance information:  Medicare PT Recommendations:  Not assessed at this time Information / Referral to community resources:  Other (Comment Required)([facility]. )  Patient/Family's Response to care:  Patient's daughter is agreeable for patient to return to ALF.   Patient/Family's Understanding of and Emotional Response to Diagnosis, Current Treatment, and Prognosis:  Patient's daughter was very pleasant and thanked CSW for assistance.   Emotional Assessment Appearance:  Appears stated age Attitude/Demeanor/Rapport:  Unable to Assess Affect (typically observed):  Unable to Assess Orientation:  Oriented to Self, Oriented to Place, Oriented to  Time, Oriented to Situation Alcohol / Substance use:  Not Applicable Psych involvement (Current and /or in the community):  No (Comment)  Discharge Needs  Concerns to be addressed:  Discharge Planning Concerns Readmission within the last 30 days:  No Current discharge risk:  Dependent with Mobility Barriers to Discharge:  Continued Medical Work up   [employer], Diana CrockerBailey M, LCSW 03/28/2018, 3:54 PM

## 2018-03-28 NOTE — Anesthesia Post-op Follow-up Note (Signed)
Anesthesia QCDR form completed.        

## 2018-03-28 NOTE — Op Note (Signed)
03/28/2018  12:47 PM  PATIENT:  Diana Meadows  84 y.o. female  PRE-OPERATIVE DIAGNOSIS:  L3 WEDGE COMPRESSION FRACTURE  POST-OPERATIVE DIAGNOSIS:  L3 WEDGE COMPRESSION FRACTURE  PROCEDURE:  Procedure(s): KYPHOPLASTY-L3 (N/A)  SURGEON: Laurene Footman, MD  ASSISTANTS: None  ANESTHESIA:   local and MAC  EBL:  Total I/O In: 400 [I.V.:400] Out: -   BLOOD ADMINISTERED:none  DRAINS: none   LOCAL MEDICATIONS USED:  MARCAINE    and XYLOCAINE   SPECIMEN:  Source of Specimen:  L3 vertebral body  DISPOSITION OF SPECIMEN:  PATHOLOGY  COUNTS:  YES  TOURNIQUET:  * No tourniquets in log *  IMPLANTS: Bone cement  DICTATION: .Dragon Dictation   patient brought the operating room and after adequate sedation was given the patient was placed prone.  C arm brought in in good visualization was obtained in both AP and lateral projections.  After patient identification and timeout procedures were completed, 10 cc of 1% Xylocaine was infiltrated on the right side in the area of the planned incision.  The back was then prepped and draped in the usual sterile fashion and repeat timeout procedure carried out.  Spinal needle was then used to get down to the pedicle on the right side at L3 and a 50-50 mix of 1% Xylocaine, half percent Sensorcaine with epinephrine was injected along the tract down to the bone.  After allowing this to set a small incision was made and a trocar advanced in an extrapedicular fashion into the vertebral body where a biopsy was obtained.  Drilling was carried out and then inflation of the balloon to about 4 cc.  The cement was mixed and was the appropriate consistency approximately 6 cc of bone cement was infiltrated into the body without extravasation getting good fill from superior to inferior endplates and right and left side.  After the cemented set trochars removed and permanent serum views were obtained.  Skin closed with Dermabond followed by Band-Aid    PLAN OF CARE:  Discharge to home after PACU  PATIENT DISPOSITION:  PACU - hemodynamically stable.

## 2018-03-29 DIAGNOSIS — M4856XA Collapsed vertebra, not elsewhere classified, lumbar region, initial encounter for fracture: Secondary | ICD-10-CM | POA: Diagnosis not present

## 2018-03-29 MED ORDER — TRAMADOL HCL 50 MG PO TABS: 50.0000 mg | ORAL_TABLET | Freq: Four times a day (QID) | ORAL | 0 refills | Status: DC | PRN

## 2018-03-29 MED ORDER — HYDROCODONE-ACETAMINOPHEN 5-325 MG PO TABS: 1.0000 | ORAL_TABLET | ORAL | 0 refills | Status: DC | PRN

## 2018-03-29 NOTE — Clinical Social Work Note (Signed)
The patient will discharge to her AL[facility] via family transport. The patient, her daughter, and the facility are aware and in agreement. The CSW has delivered the discharge packet and is signing off. Please consult should needs arise.  Argentina PonderKaren Martha Jayd Cadieux, MSW, Theresia MajorsLCSWA [phone]

## 2018-03-29 NOTE — Plan of Care (Signed)
  Problem: Education: Goal: Knowledge of General Education information will improve Description Including pain rating scale, medication(s)/side effects and non-pharmacologic comfort measures Outcome: Progressing   Problem: Health Behavior/Discharge Planning: Goal: Ability to manage health-related needs will improve Outcome: Progressing   Problem: Clinical Measurements: Goal: Ability to maintain clinical measurements within normal limits will improve Outcome: Progressing Goal: Will remain free from infection Outcome: Progressing Goal: Diagnostic test results will improve Outcome: Progressing   Problem: Activity: Goal: Risk for activity intolerance will decrease Outcome: Progressing   Problem: Nutrition: Goal: Adequate nutrition will be maintained Outcome: Progressing   Problem: Coping: Goal: Level of anxiety will decrease Outcome: Progressing   Problem: Elimination: Goal: Will not experience complications related to bowel motility Outcome: Progressing   Problem: Pain Managment: Goal: General experience of comfort will improve Outcome: Progressing   Problem: Safety: Goal: Ability to remain free from injury will improve Outcome: Progressing   Problem: Skin Integrity: Goal: Risk for impaired skin integrity will decrease Outcome: Progressing   Problem: Education: Goal: Ability to verbalize activity precautions or restrictions will improve Outcome: Progressing Goal: Knowledge of the prescribed therapeutic regimen will improve Outcome: Progressing Goal: Understanding of discharge needs will improve Outcome: Progressing   Problem: Activity: Goal: Ability to avoid complications of mobility impairment will improve Outcome: Progressing Goal: Ability to tolerate increased activity will improve Outcome: Progressing Goal: Will remain free from falls Outcome: Progressing   Problem: Bowel/Gastric: Goal: Gastrointestinal status for postoperative course will  improve Outcome: Progressing   Problem: Clinical Measurements: Goal: Ability to maintain clinical measurements within normal limits will improve Outcome: Progressing Goal: Postoperative complications will be avoided or minimized Outcome: Progressing Goal: Diagnostic test results will improve Outcome: Progressing   Problem: Pain Management: Goal: Pain level will decrease Outcome: Progressing   Problem: Skin Integrity: Goal: Will show signs of wound healing Outcome: Progressing   Problem: Health Behavior/Discharge Planning: Goal: Identification of resources available to assist in meeting health care needs will improve Outcome: Progressing   Problem: Bladder/Genitourinary: Goal: Urinary functional status for postoperative course will improve Outcome: Progressing

## 2018-03-29 NOTE — Evaluation (Signed)
Physical Therapy Evaluation Patient Details Name: Diana Meadows MRN: [id number] DOB: February 24, 1919 Today's Date: 03/29/2018   History of Present Illness  84 yo female with onset of fracture of L3 and had kyphoplasty on 7/19.  [facility] a [facility], not receiving significant high care level there.  HOH and was I with gait with no AD.  PMHx:  acute pancreatitis, asthma, cataracts, osteoporosis, COPD, SBO  Clinical Impression  Pt was seen for evaluation of mobility but is scheduled to go to ALF.  Her plan will need to include 24/7 assistance from staff for all mobility due to her weakness, or she will be unable to manage well.  If they are not able to do this may need to transition to higher care level of SNF.      Follow Up Recommendations SNF    Equipment Recommendations  None recommended by PT    Recommendations for Other Services       Precautions / Restrictions Precautions Precautions: Fall Precaution Comments: Back  Restrictions Weight Bearing Restrictions: No      Mobility  Bed Mobility Overal bed mobility: Needs Assistance             General bed mobility comments: sitting bedside when PT arrived but assisted there by staff  Transfers Overall transfer level: Needs assistance Equipment used: Rolling walker (2 wheeled);1 person hand held assist Transfers: Sit to/from Stand Sit to Stand: Mod assist;Max assist         General transfer comment: cued hand placement and assistance to power up  Ambulation/Gait Ambulation/Gait assistance: Min assist Gait Distance (Feet): 5 Feet Assistive device: Rolling walker (2 wheeled);1 person hand held assist Gait Pattern/deviations: Step-to pattern;Decreased stride length;Trunk flexed;Wide base of support Gait velocity: reduced Gait velocity interpretation: <1.8 ft/sec, indicate of risk for recurrent falls General Gait Details: pt is struggling to move the walker and sidestep even cued  Stairs            Wheelchair Mobility    Modified Rankin (Stroke Patients Only)       Balance Overall balance assessment: Needs assistance;History of Falls Sitting-balance support: Feet supported;Single extremity supported Sitting balance-Leahy Scale: Fair     Standing balance support: Bilateral upper extremity supported;During functional activity Standing balance-Leahy Scale: Poor                               Pertinent Vitals/Pain Pain Assessment: No/denies pain    Home Living Family/patient expects to be discharged to:: Assisted living               Home Equipment: Walker - 2 wheels;Cane - single point;Bedside commode Additional Comments: has not received physical assistance from staff previously for mobility    Prior Function Level of Independence: Needs assistance   Gait / Transfers Assistance Needed: gait with no assistive device in the facility  ADL's / Homemaking Assistance Needed: staff assistance for meals and housekeeping        Hand Dominance   Dominant Hand: Right    Extremity/Trunk Assessment   Upper Extremity Assessment Upper Extremity Assessment: Overall WFL for tasks assessed    Lower Extremity Assessment Lower Extremity Assessment: Generalized weakness    Cervical / Trunk Assessment Cervical / Trunk Assessment: Kyphotic  Communication   Communication: HOH(has augmented hearing device)  Cognition Arousal/Alertness: Awake/alert Behavior During Therapy: WFL for tasks assessed/performed Overall Cognitive Status: Within Functional Limits for tasks assessed  General Comments      Exercises     Assessment/Plan    PT Assessment Patient needs continued PT services  PT Problem List Decreased strength;Decreased range of motion;Decreased activity tolerance;Decreased balance;Decreased mobility;Decreased coordination;Decreased knowledge of use of DME;Decreased safety awareness;Decreased  skin integrity       PT Treatment Interventions DME instruction;Gait training;Functional mobility training;Therapeutic activities;Therapeutic exercise;Balance training;Neuromuscular re-education;Patient/family education    PT Goals (Current goals can be found in the Care Plan section)  Acute Rehab PT Goals Patient Stated Goal: to be able to walk again PT Goal Formulation: With patient Time For Goal Achievement: 04/12/18 Potential to Achieve Goals: Good    Frequency Min 2X/week   Barriers to discharge Decreased caregiver support ALF has not been providing help    Co-evaluation               AM-PAC PT "6 Clicks" Daily Activity  Outcome Measure Difficulty turning over in bed (including adjusting bedclothes, sheets and blankets)?: Unable Difficulty moving from lying on back to sitting on the side of the bed? : Unable Difficulty sitting down on and standing up from a chair with arms (e.g., wheelchair, bedside commode, etc,.)?: Unable Help needed moving to and from a bed to chair (including a wheelchair)?: A Lot Help needed walking in hospital room?: A Lot Help needed climbing 3-5 steps with a railing? : Total 6 Click Score: 8    End of Session   Activity Tolerance: Patient limited by fatigue Patient left: in bed;with call bell/phone within reach;with bed alarm set Nurse Communication: Mobility status PT Visit Diagnosis: Unsteadiness on feet (R26.81);Muscle weakness (generalized) (M62.81);History of falling (Z91.81);Difficulty in walking, not elsewhere classified (R26.2)    Time: 1610-9604 PT Time Calculation (min) (ACUTE ONLY): 31 min   Charges:   PT Evaluation $PT Eval Moderate Complexity: 1 Mod PT Treatments $Therapeutic Activity: 8-22 mins   PT G Codes:   PT G-Codes **NOT FOR INPATIENT CLASS** Functional Assessment Tool Used: AM-PAC 6 Clicks Basic Mobility   Ivar Drape 03/29/2018, 11:45 AM   Samul Dada, PT MS Acute Rehab Dept. Number: [facility] [id number] and [facility]  [phone]

## 2018-03-29 NOTE — Discharge Summary (Addendum)
Physician Discharge Summary  Patient ID: Diana Meadows MRN: [id number] DOB/AGE: 10-10-1918 84 y.o.  Admit date: 03/28/2018 Discharge date: 03/29/2018  Admission Diagnoses:  L3 WEDGE COMPRESSION FRACTURE  Discharge Diagnoses: Patient Active Problem List   Diagnosis Date Noted  . Status post kyphoplasty 03/28/2018  . Small bowel obstruction (HCC) 01/29/2017  . COPD (chronic obstructive pulmonary disease) (HCC) 09/14/2016  . GERD (gastroesophageal reflux disease) 09/14/2016  . Depression 09/14/2016  . CAP (community acquired pneumonia) 09/14/2016    Past Medical History:  Diagnosis Date  . Acute pancreatitis 2013  . Arthritis   . Asthma   . Bowel obstruction (HCC)   . Cataract cortical, senile   . Cholelithiasis without obstruction   . Chronic constipation   . Colon polyp   . COPD (chronic obstructive pulmonary disease) (HCC)   . Depression   . GERD (gastroesophageal reflux disease)   . Hernia, abdominal   . Hypertension   . Osteoporosis    Transfusion: None.   Consultants (if any):   Discharged Condition: Improved  Hospital Course: Diana Meadows is an 84 y.o. female who was admitted 03/28/2018 with a diagnosis of a L3 Wedge compression fracture and went to the operating room on 03/28/2018 and underwent the above named procedures.    Surgeries: Procedure(s): KYPHOPLASTY-L3 on 03/28/2018 Patient tolerated the surgery well. Taken to PACU where she was stabilized and then transferred to the orthopedic floor.  Foot pumps applied bilaterally at 80 mm. Heels elevated on bed with rolled towels. No evidence of DVT. Negative Homan. Physical therapy started on day #1 for gait training and transfer. OT started day #1 for ADL and assisted devices.  Patient's IV was removed on POD1.  Implants: Bone Cement  She was given perioperative antibiotics:  Anti-infectives (From admission, onward)   Start     Dose/Rate Route Frequency Ordered Stop   03/28/18 0927  ceFAZolin (ANCEF)  2-4 GM/100ML-% IVPB    Note to Pharmacy:  Rayann HemanKizziah, Kaitlin   : cabinet override      03/28/18 0927 03/28/18 1213   03/27/18 2200  ceFAZolin (ANCEF) IVPB 2g/100 mL premix     2 g 200 mL/hr over 30 Minutes Intravenous  Once 03/27/18 2156 03/28/18 1213    .  She was given sequential compression devices, early ambulation for DVT prophylaxis.  She benefited maximally from the hospital stay and there were no complications.    Recent vital signs:  Vitals:   03/28/18 2321 03/29/18 0739  BP: (!) 187/75 (!) 159/78  Pulse: 74 85  Resp: 18 17  Temp: 98.3 F (36.8 C) 97.7 F (36.5 C)  SpO2: 97% 96%    Recent laboratory studies:  Lab Results  Component Value Date   HGB 13.6 03/10/2018   HGB 13.2 01/30/2017   HGB 14.8 01/29/2017   Lab Results  Component Value Date   WBC 7.2 03/10/2018   PLT 190 03/10/2018   Lab Results  Component Value Date   INR 0.9 04/19/2014   Lab Results  Component Value Date   NA 138 03/10/2018   K 4.0 03/10/2018   CL 100 03/10/2018   CO2 32 03/10/2018   BUN 15 03/10/2018   CREATININE 0.56 03/10/2018   GLUCOSE 114 (H) 03/10/2018    Discharge Medications:   Allergies as of 03/29/2018      Reactions   Codeine Other (See Comments)   Upset stomach and constipation   Novocain [procaine] Other (See Comments)   ineffective   Nsaids Other (See  Comments)   Reaction: Upset stomach      Medication List    TAKE these medications   acetaminophen 500 MG tablet Commonly known as:  TYLENOL Take 1,000 mg by mouth 2 (two) times daily.   albuterol 108 (90 Base) MCG/ACT inhaler Commonly known as:  PROVENTIL HFA;VENTOLIN HFA Inhale 2 puffs into the lungs every 6 (six) hours as needed for wheezing or shortness of breath.   Cholecalciferol 1000 units Tbdp Take 1,000 Units by mouth daily.   cyclobenzaprine 5 MG tablet Commonly known as:  FLEXERIL Take 5 mg by mouth at bedtime as needed for muscle spasms.   estradiol 0.5 MG tablet Commonly known as:   ESTRACE Take 1 tablet by mouth daily.   fluticasone 50 MCG/ACT nasal spray Commonly known as:  FLONASE Place 2 sprays into both nostrils daily as needed for allergies.   furosemide 20 MG tablet Commonly known as:  LASIX Take 20 mg by mouth daily.   gentamicin ointment 0.1 % Commonly known as:  GARAMYCIN Apply 1 application topically 3 (three) times daily. For sore inside right nare   Magnesium 250 MG Tabs Take 1 tablet by mouth daily.   OXYGEN Inhale 2 L into the lungs at bedtime.   polyethylene glycol packet Commonly known as:  MIRALAX / GLYCOLAX Take 17 g by mouth daily as needed for mild constipation.   traMADol 50 MG tablet Commonly known as:  ULTRAM Take 1-2 tablets (50-100 mg total) by mouth every 6 (six) hours as needed.       Diagnostic Studies: Dg Lumbar Spine 2-3 Views  Result Date: 03/28/2018 CLINICAL DATA:  Painful lumbar compression fracture. EXAM: LUMBAR SPINE - 2-3 VIEW; DG C-ARM 61-120 MIN COMPARISON:  03/24/2018 lumbar MRI. FINDINGS: Apparent unipedicular approach for L3 compression fracture. IMPRESSION: Intraosseous methylmethacrylate is observed. Electronically Signed   By: Elsie Stain M.D.   On: 03/28/2018 13:45   Dg Lumbar Spine 2-3 Views  Result Date: 03/10/2018 CLINICAL DATA:  Low back pain EXAM: LUMBAR SPINE - 2-3 VIEW COMPARISON:  01/29/2017 FINDINGS: Five lumbar type vertebral bodies are well visualized. Vertebral body height is well maintained with the exception of L2 and L3 which demonstrates some mild progressive compression deformity when compare with the prior exam. Mild degenerative anterolisthesis of L4 on L5 is noted stable from the prior exam. No soft tissue abnormality is noted. IMPRESSION: Compression deformities at L2 and L3 of uncertain chronicity but new from the prior exam of 01/29/2017. Nonemergent MRI may be helpful to assess for chronicity as clinically indicated. Electronically Signed   By: Alcide Clever M.D.   On: 03/10/2018 12:44    Ct Head Wo Contrast  Result Date: 03/24/2018 CLINICAL DATA:  84 year old female who had a previous stapedectomy, reportedly on the right side. Clearance for MRI. EXAM: CT HEAD WITHOUT CONTRAST TECHNIQUE: Contiguous axial images were obtained from the base of the skull through the vertex without intravenous contrast. COMPARISON:  None. FINDINGS: Brain: There is a coarse 10 mm cortical dystrophic calcification of the right superior frontal gyrus (coronal image 27). No associated edema or mass effect. No other cortical encephalomalacia identified. Patchy and confluent scattered bilateral cerebral white matter hypodensity, with some areas most resembling chronic lacunar infarcts - particularly the left anterior corona radiata which also involves the left lentiform nuclei. The other deep gray matter nuclei are within normal limits for age. There is a small chronic infarct in the left cerebellum (series 2, image 9). No midline shift, ventriculomegaly, mass effect,  evidence of mass lesion, intracranial hemorrhage or evidence of cortically based acute infarction. Vascular: Mild for age Calcified atherosclerosis at the skull base. No suspicious intracranial vascular hyperdensity. Skull: Normal for age.  No acute osseous abnormality identified. Sinuses/Orbits: The visible paranasal sinuses are clear. Thin slice axial images of the middle ears and mastoids are provided. The right stapes appears asymmetric from the left compatible with postoperative change, but there is no metallic density implant identified. There is bilateral mastoid opacification compatible with effusions. The bilateral mastoid antrum remain clear. There is partial opacification of the left epitympanum. No acute osseous abnormality identified. Other: Negative orbits soft tissues aside from postoperative changes to the left globe. Visualized scalp soft tissues are within normal limits. IMPRESSION: 1. Nonmetallic right stapes implant. The patient IS  CLEARED TO PROCEED with MRI utilizing the standard patient instructions and precautions. 2. Chronic small vessel ischemia in the brain appears mild to moderate for age. A small area of dystrophic cortical calcification in the right superior frontal gyrus is nonspecific and could be postischemic or postinflammatory. 3. Bilateral mastoid effusions, likely postinflammatory. Electronically Signed   By: Odessa Fleming M.D.   On: 03/24/2018 10:00   Mr Lumbar Spine Wo Contrast  Result Date: 03/24/2018 CLINICAL DATA:  Sharp pain for 2 weeks. EXAM: MRI LUMBAR SPINE WITHOUT CONTRAST TECHNIQUE: Multiplanar, multisequence MR imaging of the lumbar spine was performed. No intravenous contrast was administered. COMPARISON:  None. FINDINGS: Segmentation:  Standard. Alignment:  Physiologic. Vertebrae: Acute-subacute L3 vertebral body compression fracture with associated marrow edema along the superior half of the vertebral body and approximately 15% height loss with 5 mm of retropulsion of the superior posterior margin of the L3 vertebral body mildly impressing on the thecal sac without central canal stenosis. Remainder the vertebral body heights are maintained. No discitis or osteomyelitis. Conus medullaris and cauda equina: Conus extends to the T12 level. Conus and cauda equina appear normal. Paraspinal and other soft tissues: No acute paraspinal abnormality. Disc levels: Disc spaces: Degenerative disc disease with disc height loss at L5-S1. T12-L1: No significant disc bulge. No evidence of neural foraminal stenosis. No central canal stenosis. L1-L2: No significant disc bulge. No evidence of neural foraminal stenosis. No central canal stenosis. L2-L3: No significant disc bulge. No evidence of neural foraminal stenosis. No central canal stenosis. Mild bilateral facet arthropathy. L3-L4: Mild broad-based disc bulge. Mild bilateral facet arthropathy. No evidence of neural foraminal stenosis. No central canal stenosis. L4-L5: Mild  broad-based disc bulge. Mild bilateral facet arthropathy. No evidence of neural foraminal stenosis. No central canal stenosis. L5-S1: Mild broad-based disc bulge. No evidence of neural foraminal stenosis. No central canal stenosis. IMPRESSION: 1. Acute-subacute L3 vertebral body compression fracture with approximately 15% height loss and marrow edema throughout the superior half of the vertebral body. 5 mm of retropulsion of the superior posterior margin impressing on the thecal sac without central canal stenosis. Electronically Signed   By: Elige Ko   On: 03/24/2018 10:50   Dg C-arm 1-60 Min  Result Date: 03/28/2018 CLINICAL DATA:  Painful lumbar compression fracture. EXAM: LUMBAR SPINE - 2-3 VIEW; DG C-ARM 61-120 MIN COMPARISON:  03/24/2018 lumbar MRI. FINDINGS: Apparent unipedicular approach for L3 compression fracture. IMPRESSION: Intraosseous methylmethacrylate is observed. Electronically Signed   By: Elsie Stain M.D.   On: 03/28/2018 13:45   Disposition: Plan is for discharge today 03/29/18.   Contact information for follow-up providers    Kennedy Bucker, MD In 2 weeks.   Specialty:  Orthopedic Surgery  Why:  For wound re-check Contact information: [address] [facility] [place] [address] [phone]            Contact information for after-discharge care    Destination    [facility] .   Service:  Assisted Living Contact information: [address] [place] [address] [phone]                 Signed: Meriel Pica PA-C 03/29/2018, 9:35 AM

## 2018-03-29 NOTE — Progress Notes (Signed)
Patient has been discharged home to [facility]. Patient will continue PT there. DC packet with RX's given to daughter to give to facility. AVS printed for patient. IV removed. Daughter here to transport patient. All questions answered.

## 2018-03-29 NOTE — Care Management (Signed)
Patient to be discharged today per MD order. Patient going to back to [facility]. RNCM previously communicated with facility to continue PT services via the facility. Confirmed discharge planning with patient and family. OBS letter previously given, family states they are at an understanding with the document, no further questions. Family is to provide transport. RNCM to sign off. Buddy DutyJosh Amera Banos RN BSN RNCM [phone]

## 2018-03-29 NOTE — Progress Notes (Signed)
  Subjective: 1 Day Post-Op Procedure(s) (LRB): KYPHOPLASTY-L3 (N/A) Patient reports pain as 7 on 0-10 scale.   Patient is well but states that he back pain has not changed since surgery. Plan is to go back to the facility that she was living prior to surgery. Negative for chest pain and shortness of breath Fever: no Gastrointestinal:Negative for nausea and vomiting  Objective: Vital signs in last 24 hours: Temp:  [97.6 F (36.4 C)-98.7 F (37.1 C)] 97.7 F (36.5 C) (07/20 0739) Pulse Rate:  [73-90] 85 (07/20 0739) Resp:  [16-31] 17 (07/20 0739) BP: (132-187)/(63-87) 159/78 (07/20 0739) SpO2:  [90 %-99 %] 96 % (07/20 0739) Weight:  [74.4 kg (164 lb)] 74.4 kg (164 lb) (07/19 0951)  Intake/Output from previous day:  Intake/Output Summary (Last 24 hours) at 03/29/2018 0906 Last data filed at 03/28/2018 1447 Gross per 24 hour  Intake 1100 ml  Output 200 ml  Net 900 ml    Intake/Output this shift: No intake/output data recorded.  Labs: No results for input(s): HGB in the last 72 hours. No results for input(s): WBC, RBC, HCT, PLT in the last 72 hours. No results for input(s): NA, K, CL, CO2, BUN, CREATININE, GLUCOSE, CALCIUM in the last 72 hours. No results for input(s): LABPT, INR in the last 72 hours.   EXAM General - Patient is Alert, Appropriate and Oriented Extremity - ABD soft Sensation intact distally Intact pulses distally Incision: dressing C/D/I No cellulitis present  Still tender to palpation along the lumbar spine. Negative bilateral straight leg raises. Dressing/Incision - clean, dry, no drainage Motor Function - intact, moving foot and toes well on exam.  Abdomen soft with normal BS.  Past Medical History:  Diagnosis Date  . Acute pancreatitis 2013  . Arthritis   . Asthma   . Bowel obstruction (HCC)   . Cataract cortical, senile   . Cholelithiasis without obstruction   . Chronic constipation   . Colon polyp   . COPD (chronic obstructive pulmonary  disease) (HCC)   . Depression   . GERD (gastroesophageal reflux disease)   . Hernia, abdominal   . Hypertension   . Osteoporosis     Assessment/Plan: 1 Day Post-Op Procedure(s) (LRB): KYPHOPLASTY-L3 (N/A) Active Problems:   Status post kyphoplasty  Estimated body mass index is 28.15 kg/m as calculated from the following:   Height as of this encounter: 5\' 4"  (1.626 m).   Weight as of this encounter: 74.4 kg (164 lb). Advance diet D/C IV fluids when tolerating po intake.  Pt does still complain of low back pain after the kyphoplasty. Will plan on getting the patient up with therapy today. Plan will be for discharge from the hospital today based on progress with PT.  DVT Prophylaxis - Foot Pumps and TED hose Weight-Bearing as tolerated to bilateral legs.  Valeria BatmanJ. Lance McGhee, PA-C [facility] 03/29/2018, 9:06 AM

## 2018-03-31 ENCOUNTER — Encounter: Payer: Self-pay | Admitting: Orthopedic Surgery

## 2018-03-31 LAB — SURGICAL PATHOLOGY

## 2018-08-03 ENCOUNTER — Emergency Department: Payer: Medicare Other

## 2018-08-03 ENCOUNTER — Inpatient Hospital Stay: Payer: Medicare Other

## 2018-08-03 ENCOUNTER — Encounter: Payer: Self-pay | Admitting: Emergency Medicine

## 2018-08-03 ENCOUNTER — Inpatient Hospital Stay
Admission: EM | Admit: 2018-08-03 | Discharge: 2018-08-08 | DRG: 390 | Disposition: A | Discharge status: Home / Self Care | Payer: Medicare Other | Attending: Surgery | Admitting: Surgery

## 2018-08-03 ENCOUNTER — Other Ambulatory Visit: Payer: Self-pay

## 2018-08-03 DIAGNOSIS — J449 Chronic obstructive pulmonary disease, unspecified: Secondary | ICD-10-CM | POA: Diagnosis present

## 2018-08-03 DIAGNOSIS — Z807 Family history of other malignant neoplasms of lymphoid, hematopoietic and related tissues: Secondary | ICD-10-CM

## 2018-08-03 DIAGNOSIS — K5730 Diverticulosis of large intestine without perforation or abscess without bleeding: Secondary | ICD-10-CM | POA: Diagnosis present

## 2018-08-03 DIAGNOSIS — Z9049 Acquired absence of other specified parts of digestive tract: Secondary | ICD-10-CM

## 2018-08-03 DIAGNOSIS — Z885 Allergy status to narcotic agent status: Secondary | ICD-10-CM

## 2018-08-03 DIAGNOSIS — Z8719 Personal history of other diseases of the digestive system: Secondary | ICD-10-CM | POA: Diagnosis not present

## 2018-08-03 DIAGNOSIS — Z886 Allergy status to analgesic agent status: Secondary | ICD-10-CM | POA: Diagnosis not present

## 2018-08-03 DIAGNOSIS — D72829 Elevated white blood cell count, unspecified: Secondary | ICD-10-CM | POA: Diagnosis present

## 2018-08-03 DIAGNOSIS — Z90710 Acquired absence of both cervix and uterus: Secondary | ICD-10-CM

## 2018-08-03 DIAGNOSIS — Z888 Allergy status to other drugs, medicaments and biological substances status: Secondary | ICD-10-CM

## 2018-08-03 DIAGNOSIS — R0902 Hypoxemia: Secondary | ICD-10-CM

## 2018-08-03 DIAGNOSIS — K56600 Partial intestinal obstruction, unspecified as to cause: Secondary | ICD-10-CM | POA: Diagnosis not present

## 2018-08-03 DIAGNOSIS — F329 Major depressive disorder, single episode, unspecified: Secondary | ICD-10-CM | POA: Diagnosis present

## 2018-08-03 DIAGNOSIS — I10 Essential (primary) hypertension: Secondary | ICD-10-CM | POA: Diagnosis present

## 2018-08-03 DIAGNOSIS — Z87891 Personal history of nicotine dependence: Secondary | ICD-10-CM | POA: Diagnosis not present

## 2018-08-03 DIAGNOSIS — Z831 Family history of other infectious and parasitic diseases: Secondary | ICD-10-CM

## 2018-08-03 DIAGNOSIS — Z4659 Encounter for fitting and adjustment of other gastrointestinal appliance and device: Secondary | ICD-10-CM

## 2018-08-03 DIAGNOSIS — M810 Age-related osteoporosis without current pathological fracture: Secondary | ICD-10-CM | POA: Diagnosis present

## 2018-08-03 DIAGNOSIS — R1013 Epigastric pain: Secondary | ICD-10-CM | POA: Diagnosis not present

## 2018-08-03 DIAGNOSIS — K219 Gastro-esophageal reflux disease without esophagitis: Secondary | ICD-10-CM | POA: Diagnosis present

## 2018-08-03 DIAGNOSIS — K5652 Intestinal adhesions [bands] with complete obstruction: Principal | ICD-10-CM | POA: Diagnosis present

## 2018-08-03 DIAGNOSIS — K565 Intestinal adhesions [bands], unspecified as to partial versus complete obstruction: Secondary | ICD-10-CM | POA: Diagnosis not present

## 2018-08-03 DIAGNOSIS — Z0189 Encounter for other specified special examinations: Secondary | ICD-10-CM

## 2018-08-03 DIAGNOSIS — K56609 Unspecified intestinal obstruction, unspecified as to partial versus complete obstruction: Secondary | ICD-10-CM

## 2018-08-03 DIAGNOSIS — K449 Diaphragmatic hernia without obstruction or gangrene: Secondary | ICD-10-CM | POA: Diagnosis present

## 2018-08-03 LAB — COMPREHENSIVE METABOLIC PANEL
ALBUMIN: 3.8 g/dL (ref 3.5–5.0)
ALK PHOS: 97 U/L (ref 38–126)
ALT: 17 U/L (ref 0–44)
ANION GAP: 13 (ref 5–15)
AST: 31 U/L (ref 15–41)
BILIRUBIN TOTAL: 1 mg/dL (ref 0.3–1.2)
BUN: 18 mg/dL (ref 8–23)
CALCIUM: 9.7 mg/dL (ref 8.9–10.3)
CO2: 28 mmol/L (ref 22–32)
Chloride: 100 mmol/L (ref 98–111)
Creatinine, Ser: 0.73 mg/dL (ref 0.44–1.00)
GFR calc non Af Amer: >60 mL/min (ref >60)
GLUCOSE: 111 mg/dL — AB (ref 70–99)
POTASSIUM: 4.5 mmol/L (ref 3.5–5.1)
SODIUM: 141 mmol/L (ref 135–145)
TOTAL PROTEIN: 7.4 g/dL (ref 6.5–8.1)

## 2018-08-03 LAB — POCT I-STAT, CHEM 8
BUN: 18 mg/dL (ref 8–23)
CALCIUM ION: 1.2 mmol/L (ref 1.15–1.40)
CHLORIDE: 98 mmol/L (ref 98–111)
CREATININE: 0.8 mg/dL (ref 0.44–1.00)
GLUCOSE: 117 mg/dL — AB (ref 70–99)
HCT: 46 % (ref 36.0–46.0)
Hemoglobin: 15.6 g/dL — ABNORMAL HIGH (ref 12.0–15.0)
Potassium: 4.2 mmol/L (ref 3.5–5.1)
Sodium: 139 mmol/L (ref 135–145)
TCO2: 36 mmol/L — ABNORMAL HIGH (ref 22–32)

## 2018-08-03 LAB — LIPASE, BLOOD: Lipase: 60 U/L — ABNORMAL HIGH (ref 11–51)

## 2018-08-03 LAB — CBC
HEMATOCRIT: 48 % — AB (ref 36.0–46.0)
HEMOGLOBIN: 15.5 g/dL — AB (ref 12.0–15.0)
MCH: 30.8 pg (ref 26.0–34.0)
MCHC: 32.3 g/dL (ref 30.0–36.0)
MCV: 95.2 fL (ref 80.0–100.0)
Platelets: 287 10*3/uL (ref 150–400)
RBC: 5.04 MIL/uL (ref 3.87–5.11)
RDW: 12.2 % (ref 11.5–15.5)
WBC: 12.6 10*3/uL — AB (ref 4.0–10.5)
nRBC: 0 % (ref 0.0–0.2)

## 2018-08-03 LAB — MRSA PCR SCREENING: MRSA by PCR: NEGATIVE

## 2018-08-03 MED ORDER — ENOXAPARIN SODIUM 40 MG/0.4ML ~~LOC~~ SOLN
40.0000 mg | SUBCUTANEOUS | Status: DC
  Administered 2018-08-03 – 2018-08-07 (×5): 40 mg via SUBCUTANEOUS
  Filled 2018-08-03 (×5): qty 0.4

## 2018-08-03 MED ORDER — SODIUM CHLORIDE 0.9 % IV BOLUS
500.0000 mL | Freq: Once | INTRAVENOUS | Status: AC
  Administered 2018-08-03: 500 mL via INTRAVENOUS

## 2018-08-03 MED ORDER — ONDANSETRON HCL 4 MG/2ML IJ SOLN
INTRAMUSCULAR | Status: AC
  Administered 2018-08-03: 4 mg via INTRAVENOUS
  Filled 2018-08-03: qty 2

## 2018-08-03 MED ORDER — ONDANSETRON 4 MG PO TBDP: 4.0000 mg | ORAL_TABLET | Freq: Four times a day (QID) | ORAL | Status: DC | PRN

## 2018-08-03 MED ORDER — FENTANYL CITRATE (PF) 100 MCG/2ML IJ SOLN
25.0000 ug | INTRAMUSCULAR | Status: DC | PRN
  Administered 2018-08-03 – 2018-08-05 (×6): 25 ug via INTRAVENOUS
  Filled 2018-08-03 (×6): qty 2

## 2018-08-03 MED ORDER — ACETAMINOPHEN 325 MG PO TABS: 650.0000 mg | ORAL_TABLET | Freq: Four times a day (QID) | ORAL | Status: DC | PRN

## 2018-08-03 MED ORDER — ONDANSETRON HCL 4 MG/2ML IJ SOLN
4.0000 mg | Freq: Four times a day (QID) | INTRAMUSCULAR | Status: DC | PRN
  Administered 2018-08-04: 4 mg via INTRAVENOUS
  Filled 2018-08-03: qty 2

## 2018-08-03 MED ORDER — METOCLOPRAMIDE HCL 5 MG/ML IJ SOLN
5.0000 mg | Freq: Once | INTRAMUSCULAR | Status: AC
  Administered 2018-08-03: 5 mg via INTRAVENOUS
  Filled 2018-08-03: qty 2

## 2018-08-03 MED ORDER — SODIUM CHLORIDE 0.9 % IV SOLN: Freq: Once | INTRAVENOUS | Status: DC

## 2018-08-03 MED ORDER — ONDANSETRON HCL 4 MG/2ML IJ SOLN
4.0000 mg | Freq: Once | INTRAMUSCULAR | Status: AC
  Administered 2018-08-03: 4 mg via INTRAVENOUS

## 2018-08-03 MED ORDER — IOPAMIDOL (ISOVUE-300) INJECTION 61%
100.0000 mL | Freq: Once | INTRAVENOUS | Status: AC | PRN
  Administered 2018-08-03: 100 mL via INTRAVENOUS

## 2018-08-03 MED ORDER — DIATRIZOATE MEGLUMINE & SODIUM 66-10 % PO SOLN: 90.0000 mL | Freq: Once | ORAL | Status: DC

## 2018-08-03 MED ORDER — ACETAMINOPHEN 650 MG RE SUPP: 650.0000 mg | Freq: Four times a day (QID) | RECTAL | Status: DC | PRN

## 2018-08-03 MED ORDER — TRAMADOL HCL 50 MG PO TABS
50.0000 mg | ORAL_TABLET | Freq: Four times a day (QID) | ORAL | Status: DC | PRN
  Administered 2018-08-06: 50 mg via ORAL
  Filled 2018-08-03: qty 1

## 2018-08-03 MED ORDER — KCL IN DEXTROSE-NACL 20-5-0.45 MEQ/L-%-% IV SOLN
INTRAVENOUS | Status: DC
  Administered 2018-08-03 – 2018-08-06 (×5): via INTRAVENOUS
  Filled 2018-08-03 (×6): qty 1000

## 2018-08-03 NOTE — ED Notes (Signed)
Pt taken to CT via stretcher.

## 2018-08-03 NOTE — ED Notes (Signed)
Istat Chem 8 performed per MD  Pt awaiting CT scan at this time.

## 2018-08-03 NOTE — ED Notes (Signed)
Called and spoke with Secretary and informed her that we would be bringing patient up shortly. Pt to be transported to [facility]

## 2018-08-03 NOTE — H&P (Signed)
.gen Subjective:   Patient is a 84 y.o. female presents with abdominal pain, nausea, and vomiting.  She has had multiple prior abdominal surgeries and subsequent to (presumed) adhesions and known incisional hernias from these operations, has also had multiple partial small bowel obstructions. She ate a normal breakfast this morning and had a bowel movement, but hasn't been able to tolerate any PO since.     Patient Active Problem List   Diagnosis Date Noted  . Partial small bowel obstruction (HCC) 08/03/2018  . Status post kyphoplasty 03/28/2018  . Small bowel obstruction (HCC) 01/29/2017  . COPD (chronic obstructive pulmonary disease) (HCC) 09/14/2016  . GERD (gastroesophageal reflux disease) 09/14/2016  . Depression 09/14/2016  . CAP (community acquired pneumonia) 09/14/2016   Past Medical History:  Diagnosis Date  . Acute pancreatitis 2013  . Arthritis   . Asthma   . Bowel obstruction (HCC)   . Cataract cortical, senile   . Cholelithiasis without obstruction   . Chronic constipation   . Colon polyp   . COPD (chronic obstructive pulmonary disease) (HCC)   . Depression   . GERD (gastroesophageal reflux disease)   . Hernia, abdominal   . Hypertension   . Osteoporosis     Past Surgical History:  Procedure Laterality Date  . ABDOMINAL HYSTERECTOMY     BSO  . CATARACT EXTRACTION, BILATERAL Bilateral   . CESAREAN SECTION     x 3  . CHOLECYSTECTOMY    . COLONOSCOPY    . CYST EXCISION Right    axillary  . ESOPHAGOGASTRODUODENOSCOPY  05/01/2012  . EYE SURGERY    . HERNIA REPAIR     x3 inguinal  . HIATAL HERNIA REPAIR    . KYPHOPLASTY N/A 03/28/2018   Procedure: [id number];  Surgeon: Kennedy Bucker, MD;  Location: [facility] ORS;  Service: Orthopedics;  Laterality: N/A;  . POLYPECTOMY    . SEPTOPLASTY    . STAPEDECTOMY Right   . TONSILLECTOMY       (Not in a hospital admission) Allergies  Allergen Reactions  . Codeine Other (See Comments)    Upset stomach and  constipation  . Novocain [Procaine] Other (See Comments)    ineffective  . Nsaids Other (See Comments)    Reaction: Upset stomach    Social History   Tobacco Use  . Smoking status: Former Smoker    Types: Cigarettes  . Smokeless tobacco: Never Used  Substance Use Topics  . Alcohol use: Yes    Comment: occassional    Family History  Problem Relation Age of Onset  . Lymphoma Mother   . Tuberculosis Father     Review of Systems Review of systems not obtained due to patient factors.  Actively vomiting while RN attempts to place NGT.  Objective:   Patient Vitals for the past 8 hrs:  BP Temp Temp src Pulse Resp SpO2 Height Weight  08/03/18 1600 (!) 150/84 - - 89 - 94 % - -  08/03/18 1552 (!) 167/82 - - 96 20 - - -  08/03/18 1334 (!) 159/57 - - - - 98 % 5\' 3"  (1.6 m) 72.6 kg  08/03/18 1332 - (!) 97.4 F (36.3 C) Axillary 86 20 - - -   No intake/output data recorded. No intake/output data recorded.    General appearance: alert, cooperative, mild distress and appears younger than stated age.  Nontoxic appearing. Head: Normocephalic, without obvious abnormality, atraumatic Lungs: Normal work of breathing, on [place] O2 Heart: well perfused Abdomen: soft, mildly tender to palpation,  multiple surgical scars. No peritoneal signs. Extremities: extremities normal, atraumatic, no cyanosis or edema Neurologic: Grossly normal    Data Review:Results for Darel HongGILLHAM, October V (MRN [id number]) as of 08/03/2018 20:12  Ref. Range 08/03/2018 16:15  Sodium Latest Ref Range: 135 - 145 mmol/L 139  Potassium Latest Ref Range: 3.5 - 5.1 mmol/L 4.2  Chloride Latest Ref Range: 98 - 111 mmol/L 98  Glucose Latest Ref Range: 70 - 99 mg/dL 147117 (H)  BUN Latest Ref Range: 8 - 23 mg/dL 18  Creatinine Latest Ref Range: 0.44 - 1.00 mg/dL 8.290.80  Calcium Ionized Latest Ref Range: 1.15 - 1.40 mmol/L 1.20   Results for Darel HongGILLHAM, Kaleah V (MRN [id number]) as of 08/03/2018 20:12  Ref. Range 08/03/2018 13:48  WBC  Latest Ref Range: 4.0 - 10.5 K/uL 12.6 (H)  RBC Latest Ref Range: 3.87 - 5.11 MIL/uL 5.04  Hemoglobin Latest Ref Range: 12.0 - 15.0 g/dL 78.415.5 (H)  HCT Latest Ref Range: 36.0 - 46.0 % 48.0 (H)  MCV Latest Ref Range: 80.0 - 100.0 fL 95.2  MCH Latest Ref Range: 26.0 - 34.0 pg 30.8  MCHC Latest Ref Range: 30.0 - 36.0 g/dL 69.632.3  RDW Latest Ref Range: 11.5 - 15.5 % 12.2  Platelets Latest Ref Range: 150 - 400 K/uL 287  nRBC Latest Ref Range: 0.0 - 0.2 % 0.0  IMPRESSION: 1. Proximal small bowel obstruction with transition point in the left lower quadrant near the umbilicus, probably due to adhesions. 2. Increased size of hernia at the superior margin of the mesh hernia repair containing a loop of transverse colon without associated obstruction or inflammation. 3. Stable moderate to large hiatal hernia. 4.  Aortic Atherosclerosis (ICD10-I70.0). 5. Sigmoid diverticulosis without findings of acute diverticulitis.   Electronically Signed   By: Mitzi HansenLance  Furusawa-Stratton M.D.   On: 08/03/2018 17:24   Assessment:   Active Problems:   Partial small bowel obstruction [facility](HCC)   Plan:   Ms. Loura HaltGillham has had multiple prior episodes of pSBO which have responded to conservative management.  In light of her advanced age, my preference is to again pursue conservative treatment. [facility] NPO IVF NGT Pain control Monitor for acute changes that may warrant operative intervention.

## 2018-08-03 NOTE — ED Triage Notes (Signed)
Pt to ED with c/o of abdominal pain that started yesterday and vomiting that started this morning. Pt has hx of multiple hernia. Pt gallbladder removed. Pt still has appendix.

## 2018-08-03 NOTE — ED Notes (Signed)
Denny PeonKim M RN, spoke with Dr. Alphonzo LemmingsMcShane re: pt's chief complaint, new order received for 500cc NaCl bolus and 4mg  Zofran IV while pt waits to be roomed to provider.

## 2018-08-03 NOTE — ED Provider Notes (Signed)
[facility] Emergency Department Provider Note    First MD Initiated Contact with Patient 08/03/18 1453     (approximate)  I have reviewed the triage vital signs and the nursing notes.   HISTORY  Chief Complaint Abdominal Pain    HPI Diana Meadows is a 84 y.o. female with the below listed past medical history presents the ER for abdominal pain as started yesterday associate with nausea and vomiting.  Has had similar presentations related to SBO related to hernias.  Denies any measured fevers.  Was able to eat this morning but has not been able to keep anything down since then has been having recurrent belching.  States the pain is moderate to severe.  No pain radiating through to her back.  States that she did move her bowels normally this morning.  Is not passing gas at this time.    Past Medical History:  Diagnosis Date  . Acute pancreatitis 2013  . Arthritis   . Asthma   . Bowel obstruction (HCC)   . Cataract cortical, senile   . Cholelithiasis without obstruction   . Chronic constipation   . Colon polyp   . COPD (chronic obstructive pulmonary disease) (HCC)   . Depression   . GERD (gastroesophageal reflux disease)   . Hernia, abdominal   . Hypertension   . Osteoporosis    Family History  Problem Relation Age of Onset  . Lymphoma Mother   . Tuberculosis Father    Past Surgical History:  Procedure Laterality Date  . ABDOMINAL HYSTERECTOMY     BSO  . CATARACT EXTRACTION, BILATERAL Bilateral   . CESAREAN SECTION     x 3  . CHOLECYSTECTOMY    . COLONOSCOPY    . CYST EXCISION Right    axillary  . ESOPHAGOGASTRODUODENOSCOPY  05/01/2012  . EYE SURGERY    . HERNIA REPAIR     x3 inguinal  . HIATAL HERNIA REPAIR    . KYPHOPLASTY N/A 03/28/2018   Procedure: [id number];  Surgeon: Kennedy BuckerMenz, Michael, MD;  Location: [facility] ORS;  Service: Orthopedics;  Laterality: N/A;  . POLYPECTOMY    . SEPTOPLASTY    . STAPEDECTOMY Right   . TONSILLECTOMY      Patient Active Problem List   Diagnosis Date Noted  . Partial small bowel obstruction (HCC) 08/03/2018  . Status post kyphoplasty 03/28/2018  . Small bowel obstruction (HCC) 01/29/2017  . COPD (chronic obstructive pulmonary disease) (HCC) 09/14/2016  . GERD (gastroesophageal reflux disease) 09/14/2016  . Depression 09/14/2016  . CAP (community acquired pneumonia) 09/14/2016      Prior to Admission medications   Medication Sig Start Date End Date Taking? Authorizing Provider  acetaminophen (TYLENOL) 500 MG tablet Take 1,000 mg by mouth 2 (two) times daily.    [provider]  albuterol (PROVENTIL HFA;VENTOLIN HFA) 108 (90 Base) MCG/ACT inhaler Inhale 2 puffs into the lungs every 6 (six) hours as needed for wheezing or shortness of breath.     [provider]  Cholecalciferol 1000 units TBDP Take 1,000 Units by mouth daily.    [provider]  cyclobenzaprine (FLEXERIL) 5 MG tablet Take 5 mg by mouth at bedtime as needed for muscle spasms.     [provider]  estradiol (ESTRACE) 0.5 MG tablet Take 1 tablet by mouth daily. 11/12/16   [provider]  fluticasone (FLONASE) 50 MCG/ACT nasal spray Place 2 sprays into both nostrils daily as needed for allergies.  01/20/17   [provider]  furosemide (LASIX) 20 MG tablet Take 20 mg by mouth daily. 02/24/18   [provider]  gentamicin ointment (GARAMYCIN) 0.1 % Apply 1 application topically 3 (three) times daily. For sore inside right nare 01/28/18   [provider]  Magnesium 250 MG TABS Take 1 tablet by mouth daily.    [provider]  OXYGEN Inhale 2 L into the lungs at bedtime.    [provider]  polyethylene glycol (MIRALAX / GLYCOLAX) packet Take 17 g by mouth daily as needed for mild constipation.    [provider]  traMADol (ULTRAM) 50 MG tablet Take 1-2 tablets (50-100 mg total) by mouth every 6 (six) hours as needed. 03/29/18   Anson Oregon, PA-C    Allergies Codeine; Novocain [procaine]; and Nsaids    Social History Social History   Tobacco Use  . Smoking status: Former Smoker    Types: Cigarettes  . Smokeless tobacco: Never Used  Substance Use Topics  . Alcohol use: Yes    Comment: occassional  . Drug use: Never    Review of Systems Patient denies headaches, rhinorrhea, blurry vision, numbness, shortness of breath, chest pain, edema, cough, abdominal pain, nausea, vomiting, diarrhea, dysuria, fevers, rashes or hallucinations unless otherwise stated above in HPI. ____________________________________________   PHYSICAL EXAM:  VITAL SIGNS: Vitals:   08/03/18 1827 08/03/18 1949  BP: (!) 175/87 (!) 172/87  Pulse: (!) 106 (!) 101  Resp: 20 18  Temp:  97.9 F (36.6 C)  SpO2:  97%    Constitutional: Alert and oriented. Sitting upright in bed, appears uncomfortable Eyes: Conjunctivae are normal.  Head: Atraumatic. Nose: No congestion/rhinnorhea. Mouth/Throat: Mucous membranes are moist.   Neck: No stridor. Painless ROM.  Cardiovascular: Normal rate, regular rhythm. Grossly normal heart sounds.  Good peripheral circulation. Respiratory: Normal respiratory effort.  No retractions. Lungs CTAB. Gastrointestinal: Soft but diffusely tender,  Tympanic to percussion. No distention. No abdominal bruits. No CVA tenderness. Genitourinary: deferred Musculoskeletal: No lower extremity tenderness nor edema.  No joint effusions. Neurologic:  Normal speech and language. No gross focal neurologic deficits are appreciated. No facial droop Skin:  Skin is warm, dry and intact. No rash noted. Psychiatric: Mood and affect are normal. Speech and behavior are normal.  ____________________________________________   LABS (all labs ordered are listed, but only abnormal results are displayed)  Results for orders placed or performed during the hospital encounter of 08/03/18 (from the past 24 hour(s))  CBC     Status:  Abnormal   Collection Time: 08/03/18  1:48 PM  Result Value Ref Range   WBC 12.6 (H) 4.0 - 10.5 K/uL   RBC 5.04 3.87 - 5.11 MIL/uL   Hemoglobin 15.5 (H) 12.0 - 15.0 g/dL   HCT 88.4 (H) 16.6 - 06.3 %   MCV 95.2 80.0 - 100.0 fL   MCH 30.8 26.0 - 34.0 pg   MCHC 32.3 30.0 - 36.0 g/dL   RDW 01.6 01.0 - 93.2 %   Platelets 287 150 - 400 K/uL   nRBC 0.0 0.0 - 0.2 %  I-STAT, chem 8     Status: Abnormal   Collection Time: 08/03/18  4:15 PM  Result Value Ref Range   Sodium 139 135 - 145 mmol/L   Potassium 4.2 3.5 - 5.1 mmol/L   Chloride 98 98 - 111 mmol/L   BUN 18 8 - 23 mg/dL   Creatinine, Ser 3.55 0.44 - 1.00 mg/dL   Glucose, Bld 732 (H) 70 -  99 mg/dL   Calcium, Ion 1.61 1.15 - 1.40 mmol/L   TCO2 36 (H) 22 - 32 mmol/L   Hemoglobin 15.6 (H) 12.0 - 15.0 g/dL   HCT 09.6 04.5 - 40.9 %   ____________________________________________  EKG My review and personal interpretation at Time: 13:57   Indication: N/V  Rate: 95  Rhythm: sinus Axis: normal Other: nonspecific st abn, no stemi criteria ____________________________________________  RADIOLOGY  I personally reviewed all radiographic images ordered to evaluate for the above acute complaints and reviewed radiology reports and findings.  These findings were personally discussed with the patient.  Please see medical record for radiology report.  ____________________________________________   PROCEDURES  Procedure(s) performed:  Procedures    Critical Care performed: no ____________________________________________   INITIAL IMPRESSION / ASSESSMENT AND PLAN / ED COURSE  Pertinent labs & imaging results that were available during my care of the patient were reviewed by me and considered in my medical decision making (see chart for details).   DDX: sbo, pancreatitis, diverticulitis, uti,   Diana Meadows is a 84 y.o. who presents to the ED with nausea vomiting as described above.  Certainly given her history of SBO, get hernias  will order CT imaging to evaluate for the above differential.  Will provide IV pain medication as well as IV fluids and IV antiemetics.  Clinical Course as of Aug 03 2045  Wynelle Link Aug 03, 2018  1603 Likely lab complication therefore will add on i-STAT so that we can safely order CT imaging with contrast after evaluating for renal dysfunction.   [PR]  1736 Discussed results of CT imaging with patient.  Tried manually reducing the periumbilical hernia.  It was reducible with no improvement in her symptoms may have had small roughly nickel sized piece that was residual.  Not convinced that this is the obstructive point.  Concerning for obstruction secondary to adhesions.  Will consult general surgery.  Will place NG tube.   [PR]  1742 I consulted general surgery who agrees with IV fluids and NG tube management.  Dr. Lady Gary kindly agrees to consult on patient.   [PR]    Clinical Course User Index [PR] Willy Eddy, MD     As part of my medical decision making, I reviewed the following data within the electronic MEDICAL RECORD NUMBER Nursing notes reviewed and incorporated, Labs reviewed, notes from prior ED visits.  ____________________________________________   FINAL CLINICAL IMPRESSION(S) / ED DIAGNOSES  Final diagnoses:  Small bowel obstruction due to adhesions (HCC)  Epigastric pain      NEW MEDICATIONS STARTED DURING THIS VISIT:  Current Discharge Medication List       Note:  This document was prepared using Dragon voice recognition software and may include unintentional dictation errors.    Willy Eddy, MD 08/03/18 2046

## 2018-08-04 ENCOUNTER — Inpatient Hospital Stay: Payer: Medicare Other

## 2018-08-04 DIAGNOSIS — K56600 Partial intestinal obstruction, unspecified as to cause: Secondary | ICD-10-CM

## 2018-08-04 LAB — URINALYSIS, COMPLETE (UACMP) WITH MICROSCOPIC
Bacteria, UA: NONE SEEN
Bilirubin Urine: NEGATIVE
GLUCOSE, UA: NEGATIVE mg/dL
Hgb urine dipstick: NEGATIVE
Ketones, ur: 5 mg/dL — AB
Leukocytes, UA: NEGATIVE
NITRITE: NEGATIVE
PH: 5 (ref 5.0–8.0)
Protein, ur: NEGATIVE mg/dL

## 2018-08-04 LAB — MAGNESIUM: Magnesium: 2 mg/dL (ref 1.7–2.4)

## 2018-08-04 LAB — BASIC METABOLIC PANEL
Anion gap: 10 (ref 5–15)
BUN: 17 mg/dL (ref 8–23)
CALCIUM: 9 mg/dL (ref 8.9–10.3)
CHLORIDE: 100 mmol/L (ref 98–111)
CO2: 30 mmol/L (ref 22–32)
Creatinine, Ser: 0.76 mg/dL (ref 0.44–1.00)
GFR calc Af Amer: >60 mL/min (ref >60)
GFR calc non Af Amer: >60 mL/min (ref >60)
GLUCOSE: 137 mg/dL — AB (ref 70–99)
Potassium: 4.2 mmol/L (ref 3.5–5.1)
Sodium: 140 mmol/L (ref 135–145)

## 2018-08-04 LAB — CBC
HEMATOCRIT: 42.5 % (ref 36.0–46.0)
HEMOGLOBIN: 13.7 g/dL (ref 12.0–15.0)
MCH: 30.9 pg (ref 26.0–34.0)
MCHC: 32.2 g/dL (ref 30.0–36.0)
MCV: 95.9 fL (ref 80.0–100.0)
Platelets: 261 10*3/uL (ref 150–400)
RBC: 4.43 MIL/uL (ref 3.87–5.11)
RDW: 12.4 % (ref 11.5–15.5)
WBC: 17 10*3/uL — AB (ref 4.0–10.5)
nRBC: 0 % (ref 0.0–0.2)

## 2018-08-04 LAB — LACTIC ACID, PLASMA
LACTIC ACID, VENOUS: 0.8 mmol/L (ref 0.5–1.9)
Lactic Acid, Venous: 0.8 mmol/L (ref 0.5–1.9)

## 2018-08-04 LAB — PHOSPHORUS: Phosphorus: 3.1 mg/dL (ref 2.5–4.6)

## 2018-08-04 MED ORDER — PHENOL 1.4 % MT LIQD
1.0000 | OROMUCOSAL | Status: DC | PRN
  Filled 2018-08-04: qty 177

## 2018-08-04 NOTE — Progress Notes (Addendum)
SURGICAL PROGRESS NOTE (cpt [phone])  Hospital Day(s): 1.   Post op day(s):  Marland Kitchen   Interval History: Patient seen and examined, no acute events or new complaints overnight. Patient reports she has had improvement in her symptoms but still has some abdominal pain, denies fever, chills, nausea, or emesis. NGT in place and has been NPO. Has not been out of bed.   Review of Systems:  Constitutional: denies fever, chills  HEENT: denies cough or congestion  Respiratory: denies any shortness of breath  Cardiovascular: denies chest pain or palpitations  Gastrointestinal: *+ abdominal pain, denied N/V, or diarrhea/and bowel function as per interval history Genitourinary: denies burning with urination or urinary frequency Musculoskeletal: denies pain, decreased motor or sensation Integumentary: denies any other rashes or skin discolorations except abdominal surgical scars Neurological: denies HA or vision/hearing changes   Vital signs in last 24 hours: [min-max] current  Temp:  [97.4 F (36.3 C)-97.9 F (36.6 C)] 97.8 F (36.6 C) (11/25 0557) Pulse Rate:  [86-106] 102 (11/25 0557) Resp:  [18-20] 20 (11/25 0557) BP: (149-175)/(57-87) 149/70 (11/25 0557) SpO2:  [93 %-98 %] 93 % (11/25 0557) Weight:  [72.6 kg-75.1 kg] 75.1 kg (11/24 1949)     Height: 5\' 2"  (157.5 cm) Weight: 75.1 kg BMI (Calculated): 30.27   Intake/Output this shift:  No intake/output data recorded.   Intake/Output last 2 shifts:  @IOLAST2SHIFTS @   Physical Exam:  Constitutional: alert, cooperative and no distress  HENT: normocephalic without obvious abnormality  Eyes: EOM's grossly intact and symmetric  Respiratory: breathing non-labored at rest  Gastrointestinal: soft, tenderness worse in the RUQ, and non-distended. Multiple surgical scars present Musculoskeletal: no edema or wounds, motor and sensation grossly intact, NT    Labs:  CBC Latest Ref Rng & Units 08/04/2018 08/03/2018 08/03/2018  WBC 4.0 - 10.5 K/uL  17.0(H) - 12.6(H)  Hemoglobin 12.0 - 15.0 g/dL 62.1 15.6(H) 15.5(H)  Hematocrit 36.0 - 46.0 % 42.5 46.0 48.0(H)  Platelets 150 - 400 K/uL 261 - 287   CMP Latest Ref Rng & Units 08/04/2018 08/03/2018 08/03/2018  Glucose 70 - 99 mg/dL 308(M) 578(I) 696(E)  BUN 8 - 23 mg/dL 17 18 18   Creatinine 0.44 - 1.00 mg/dL 9.52 8.41 3.24  Sodium 135 - 145 mmol/L 140 141 139  Potassium 3.5 - 5.1 mmol/L 4.2 4.5 4.2  Chloride 98 - 111 mmol/L 100 100 98  CO2 22 - 32 mmol/L 30 28 -  Calcium 8.9 - 10.3 mg/dL 9.0 9.7 -  Total Protein 6.5 - 8.1 g/dL - 7.4 -  Total Bilirubin 0.3 - 1.2 mg/dL - 1.0 -  Alkaline Phos 38 - 126 U/L - 97 -  AST 15 - 41 U/L - 31 -  ALT 0 - 44 U/L - 17 -   Lactic acid: 0.8  Imaging studies: No new pertinent imaging studies   Assessment/Plan: (ICD-10's: K69.52) 84 y.o. female with symptomatically improving complete small bowel obstruction, likely attributable to post-surgical adhesions following a multitude of abdominal surgeries, complicated by pertinent comorbidities including a history of small bowel obstructions, COPD, GERD, HTN, former tobacco abuse (smoking), and advanced age.  - NPO for now, IV fluids             - Continue NG tube for nasogastric decompression, will get KUB to reassess positioning             - monitor ongoing bowel function and abdominal exam   - Monitor leukocytosis, no clinical signs of infection  - surgical  intervention if doesn't improve was also discussed             - medical management comorbidities             - ambulation encouraged              - DVT prophylaxis   All of the above findings and recommendations were discussed with the patient, and the medical team, and all of patient's questions were answered to her expressed satisfaction.   -- Lynden OxfordZachary Schulz, PA-C [facility] 08/04/2018, 9:32 AM [phone] M-F: 7am - 4pm  I saw and evaluated the patient.  I agree with the above documentation, exam, and  plan, which I have edited where appropriate. Duanne GuessJennifer Jaylinn Hellenbrand  9:39 AM

## 2018-08-05 ENCOUNTER — Inpatient Hospital Stay: Payer: Medicare Other

## 2018-08-05 LAB — BASIC METABOLIC PANEL
Anion gap: 5 (ref 5–15)
BUN: 17 mg/dL (ref 8–23)
CHLORIDE: 103 mmol/L (ref 98–111)
CO2: 32 mmol/L (ref 22–32)
CREATININE: 0.69 mg/dL (ref 0.44–1.00)
Calcium: 8.4 mg/dL — ABNORMAL LOW (ref 8.9–10.3)
Glucose, Bld: 121 mg/dL — ABNORMAL HIGH (ref 70–99)
POTASSIUM: 4.1 mmol/L (ref 3.5–5.1)
SODIUM: 140 mmol/L (ref 135–145)

## 2018-08-05 LAB — CBC
HEMATOCRIT: 40.6 % (ref 36.0–46.0)
Hemoglobin: 12.6 g/dL (ref 12.0–15.0)
MCH: 31.1 pg (ref 26.0–34.0)
MCHC: 31 g/dL (ref 30.0–36.0)
MCV: 100.2 fL — AB (ref 80.0–100.0)
Platelets: 215 10*3/uL (ref 150–400)
RBC: 4.05 MIL/uL (ref 3.87–5.11)
RDW: 12.4 % (ref 11.5–15.5)
WBC: 14.8 10*3/uL — ABNORMAL HIGH (ref 4.0–10.5)
nRBC: 0 % (ref 0.0–0.2)

## 2018-08-05 LAB — MAGNESIUM: Magnesium: 2 mg/dL (ref 1.7–2.4)

## 2018-08-05 MED ORDER — ACETAMINOPHEN 500 MG PO TABS
1000.0000 mg | ORAL_TABLET | Freq: Four times a day (QID) | ORAL | Status: DC
  Administered 2018-08-05 – 2018-08-08 (×10): 1000 mg via ORAL
  Filled 2018-08-05 (×12): qty 2

## 2018-08-05 MED ORDER — IOPAMIDOL (ISOVUE-300) INJECTION 61%
15.0000 mL | INTRAVENOUS | Status: AC
  Administered 2018-08-05 (×2): 15 mL via ORAL

## 2018-08-05 MED ORDER — KETOROLAC TROMETHAMINE 15 MG/ML IJ SOLN: 15.0000 mg | Freq: Four times a day (QID) | INTRAMUSCULAR | Status: DC | PRN

## 2018-08-05 MED ORDER — IOPAMIDOL (ISOVUE-300) INJECTION 61%
100.0000 mL | Freq: Once | INTRAVENOUS | Status: AC | PRN
  Administered 2018-08-05: 100 mL via INTRAVENOUS

## 2018-08-05 MED ORDER — GABAPENTIN 600 MG PO TABS
300.0000 mg | ORAL_TABLET | Freq: Three times a day (TID) | ORAL | Status: DC
  Administered 2018-08-05 – 2018-08-08 (×9): 300 mg via ORAL
  Filled 2018-08-05 (×9): qty 1

## 2018-08-05 NOTE — Progress Notes (Addendum)
SURGICAL PROGRESS NOTE (cpt [phone])  Hospital Day(s): 2.   Post op day(s):  Marland Kitchen   Interval History: Patient seen and examined, no acute events or new complaints overnight. Patient reports that she continues to have some intermittent abdominal pain in the RUQ but improves with pain medications as well as a sore throat from the NGT, denies fever, chills, nausea, or emesis. She notes that she has had 2 episodes of flatus yesterday and one this morning, but nothing significant that she can recall. Mobilized briefly in the hall yesterday.   Review of Systems:  Constitutional: denies fever, chills  HEENT: denies cough or congestion  Respiratory: denies any shortness of breath  Cardiovascular: denies chest pain or palpitations  Gastrointestinal: + abdominal pain, denied N/V, or diarrhea/and bowel function as per interval history Genitourinary: denies burning with urination or urinary frequency Musculoskeletal: denies pain, decreased motor or sensation Integumentary: denies any other rashes or skin discolorations except multiple previous abdominal surgical scars Neurological: denies HA or vision/hearing changes   Vital signs in last 24 hours: [min-max] current  Temp:  [97.9 F (36.6 C)-99.6 F (37.6 C)] 97.9 F (36.6 C) (11/26 0427) Pulse Rate:  [85-100] 85 (11/26 0427) Resp:  [19-22] 22 (11/26 0427) BP: (140-172)/(58-84) 165/64 (11/26 0427) SpO2:  [94 %-95 %] 95 % (11/26 0427)     Height: 5\' 2"  (157.5 cm) Weight: 75.1 kg BMI (Calculated): 30.27   Intake/Output this shift:  No intake/output data recorded.   Intake/Output last 2 shifts:  @IOLAST2SHIFTS @   Physical Exam:  Constitutional: alert, cooperative and no distress  HENT: normocephalic without obvious abnormality  Eyes: EOM's grossly intact and symmetric  Respiratory: breathing non-labored at rest  Gastrointestinal: soft, tenderness worse in the RUQ, and non-distended. Multiple surgical scars present Musculoskeletal: no edema  or wounds, motor and sensation grossly intact, NT    Labs:  CBC Latest Ref Rng & Units 08/05/2018 08/04/2018 08/03/2018  WBC 4.0 - 10.5 K/uL 14.8(H) 17.0(H) -  Hemoglobin 12.0 - 15.0 g/dL 60.4 54.0 15.6(H)  Hematocrit 36.0 - 46.0 % 40.6 42.5 46.0  Platelets 150 - 400 K/uL 215 261 -   CMP Latest Ref Rng & Units 08/05/2018 08/04/2018 08/03/2018  Glucose 70 - 99 mg/dL 981(X) 914(N) 829(F)  BUN 8 - 23 mg/dL 17 17 18   Creatinine 0.44 - 1.00 mg/dL 6.21 3.08 6.57  Sodium 135 - 145 mmol/L 140 140 141  Potassium 3.5 - 5.1 mmol/L 4.1 4.2 4.5  Chloride 98 - 111 mmol/L 103 100 100  CO2 22 - 32 mmol/L 32 30 28  Calcium 8.9 - 10.3 mg/dL 8.4(O) 9.0 9.7  Total Protein 6.5 - 8.1 g/dL - - 7.4  Total Bilirubin 0.3 - 1.2 mg/dL - - 1.0  Alkaline Phos 38 - 126 U/L - - 97  AST 15 - 41 U/L - - 31  ALT 0 - 44 U/L - - 17     Imaging studies: No new pertinent imaging studies   Assessment/Plan: (ICD-10's: K56.51) 84 y.o. femalewith clinically stable but not significantly improved partial small bowel obstruction, likely attributable to post-surgical adhesions following a multitude of abdominal surgeries, complicated by pertinent comorbidities including a history of small bowel obstructions, COPD, GERD, HTN, former tobacco abuse (smoking), and advanced age.  - NPO for now, IV fluids - Continue NG tube for nasogastric decompression, monitor output (300 ccs per chart review), leave in until significant return of bowel function  - will repeat KUB given persistent abdominal pain, may consider gastrografin study as  well pending results - monitor ongoing bowel function and abdominal exam              - Monitor leukocytosis, no clinical signs of infection  - Monitor electrolytes while NPO             - surgical intervention if doesn't improve was also discussed - medical management comorbidities - ambulation encouraged(offered PT eval but the patient  declined and appreciates working with nursing staff). - DVT prophylaxis   All of the above findings and recommendations were discussed with the patient, and the medical team, and all of patient's questions were answered to her expressed satisfaction.  -- Lynden OxfordZachary Arthor Gorter, PA-C [facility] 08/04/2018, 9:32 AM [phone] M-F: 7am - 4pm

## 2018-08-05 NOTE — Clinical Social Work Note (Signed)
Clinical Social Work Assessment  Patient Details  Name: Diana Meadows MRN: [id number] Date of Birth: 1919/05/13  Date of referral:  08/05/18               Reason for consult:  Discharge Planning                Permission sought to share information with:  Facility Industrial/product designerContact Representative Permission granted to share information::  Yes, Verbal Permission Granted  Name::        Agency::     Relationship::     Contact Information:     Housing/Transportation Living arrangements for the past 2 months:  Assisted Living Facility Source of Information:  Patient, Facility Patient Interpreter Needed:  None Criminal Activity/Legal Involvement Pertinent to Current Situation/Hospitalization:  No - Comment as needed Significant Relationships:    Lives with:  Facility Resident Do you feel safe going back to the place where you live?  Yes Need for family participation in patient care:  No (Coment)  Care giving concerns:  Patient resides long term at [facility].   Social Worker assessment / plan:  CSW spoke with Misty StanleyLisa at [facility] and she states patient is independent there and oversees her own medication. Patient participates in yoga and bicycling. Patient is at the facility Misty StanleyLisa states for socialization purposes. Misty StanleyLisa states she does have oxygen already at the facility but does not have a portable. Misty StanleyLisa stated she would have to call me back with the DME agency that supplies it. They are able to take patient back when time and she stated that patient's daughter typically transports her.   CSW spoke with patient who tells me that she no longer can do the yoga but is able to ride the stationary bike for 10 minutes every morning. She stated she does wish to return at discharge to [facility].   Employment status:  Retired Health and safety inspectornsurance information:    PT Recommendations:    Information / Referral to community resources:     Patient/Family's Response to care:  Patient expressed appreciation for CSW  visit.  Patient/Family's Understanding of and Emotional Response to Diagnosis, Current Treatment, and Prognosis:  Patient admits to having a difficult time with her recovery after surgery and with the ng tube, but she states she is ambulating in the hallway and doing what she can so she can feel better.   Emotional Assessment Appearance:  Appears stated age Attitude/Demeanor/Rapport:  (pleasant and cooperative) Affect (typically observed):  Calm Orientation:  Oriented to Self, Oriented to Place, Oriented to  Time, Oriented to Situation Alcohol / Substance use:  Not Applicable Psych involvement (Current and /or in the community):  No (Comment)  Discharge Needs  Concerns to be addressed:  Care Coordination Readmission within the last 30 days:  No Current discharge risk:  None, Physical Impairment Barriers to Discharge:      York SpanielMonica Marguis Mathieson, LCSW 08/05/2018, 11:22 AM

## 2018-08-05 NOTE — Progress Notes (Signed)
Pt seen and examined. CT reviewed. Resolved partial SBO. On exam ventral hernia is reducible. 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 No surgical issues. We will start gabapentin. PT and DC NGT

## 2018-08-05 NOTE — NC FL2 (Signed)
[place] MEDICAID FL2 LEVEL OF CARE SCREENING TOOL     IDENTIFICATION  Patient Name: Diana Meadows Birthdate: 04/20/1919 Sex: female Admission Date (Current Location): 08/03/2018  [facility] [place]   Facility and Address:  [facility], [address], [place], [place] [address]      Provider Number: [id number]  Attending Physician Name and Address:  Duanne Guessannon, Jennifer, MD  Relative Name and Phone Number:       Current Level of Care: Hospital Recommended Level of Care: Assisted Living Facility Prior Approval Number:    Date Approved/Denied:   PASRR Number:    Discharge Plan: (Assisted LIving)    Current Diagnoses: Patient Active Problem List   Diagnosis Date Noted  . Partial small bowel obstruction (HCC) 08/03/2018  . Status post kyphoplasty 03/28/2018  . Small bowel obstruction (HCC) 01/29/2017  . COPD (chronic obstructive pulmonary disease) (HCC) 09/14/2016  . GERD (gastroesophageal reflux disease) 09/14/2016  . Depression 09/14/2016  . CAP (community acquired pneumonia) 09/14/2016    Orientation RESPIRATION BLADDER Height & Weight     Self, Time, Situation, Place  Normal Continent Weight: 165 lb 9.1 oz (75.1 kg) Height:  5\' 2"  (157.5 cm)  BEHAVIORAL SYMPTOMS/MOOD NEUROLOGICAL BOWEL NUTRITION STATUS  (none) (none) Continent    AMBULATORY STATUS COMMUNICATION OF NEEDS Skin   Supervision Verbally Normal                       Personal Care Assistance Level of Assistance  Bathing, Feeding, Dressing Bathing Assistance: Independent Feeding assistance: Independent Dressing Assistance: Independent     Functional Limitations Info  (no issues reported)          SPECIAL CARE FACTORS FREQUENCY                       Contractures Contractures Info: Not present    Additional Factors Info  Code Status Code Status Info: full             Current Medications (08/05/2018):  This is the current  hospital active medication list Current Facility-Administered Medications  Medication Dose Route Frequency Provider Last Rate Last Dose  . 0.9 %  sodium chloride infusion   Intravenous Once Willy Eddyobinson, Patrick, MD      . acetaminophen (TYLENOL) tablet 650 mg  650 mg Oral Q6H PRN Duanne Guessannon, Jennifer, MD       Or  . acetaminophen (TYLENOL) suppository 650 mg  650 mg Rectal Q6H PRN Duanne Guessannon, Jennifer, MD      . dextrose 5 % and 0.45 % NaCl with KCl 20 mEq/L infusion   Intravenous Continuous Duanne Guessannon, Jennifer, MD 75 mL/hr at 08/05/18 0444    . enoxaparin (LOVENOX) injection 40 mg  40 mg Subcutaneous Q24H Duanne Guessannon, Jennifer, MD   40 mg at 08/04/18 1850  . fentaNYL (SUBLIMAZE) injection 25 mcg  25 mcg Intravenous Q1H PRN Willy Eddyobinson, Patrick, MD   25 mcg at 08/05/18 0659  . ketorolac (TORADOL) 15 MG/ML injection 15 mg  15 mg Intravenous Q6H PRN Donovan KailSchulz, Zachary R, PA-C      . ondansetron (ZOFRAN-ODT) disintegrating tablet 4 mg  4 mg Oral Q6H PRN Duanne Guessannon, Jennifer, MD       Or  . ondansetron [facility](ZOFRAN) injection 4 mg  4 mg Intravenous Q6H PRN Duanne Guessannon, Jennifer, MD   4 mg at 08/04/18 1132  . phenol (CHLORASEPTIC) mouth spray 1 spray  1 spray Mouth/Throat PRN Donovan KailSchulz, Zachary R, PA-C      .  traMADol (ULTRAM) tablet 50 mg  50 mg Oral Q6H PRN Duanne Guess, MD         Discharge Medications: Please see discharge summary for a list of discharge medications.  Relevant Imaging Results:  Relevant Lab Results:   Additional Information    York Spaniel, LCSW

## 2018-08-06 ENCOUNTER — Inpatient Hospital Stay: Payer: Medicare Other

## 2018-08-06 DIAGNOSIS — K565 Intestinal adhesions [bands], unspecified as to partial versus complete obstruction: Secondary | ICD-10-CM

## 2018-08-06 LAB — BASIC METABOLIC PANEL
ANION GAP: 4 — AB (ref 5–15)
BUN: 12 mg/dL (ref 8–23)
CHLORIDE: 104 mmol/L (ref 98–111)
CO2: 33 mmol/L — AB (ref 22–32)
Calcium: 8 mg/dL — ABNORMAL LOW (ref 8.9–10.3)
Creatinine, Ser: 0.56 mg/dL (ref 0.44–1.00)
GFR calc Af Amer: >60 mL/min (ref >60)
GFR calc non Af Amer: >60 mL/min (ref >60)
GLUCOSE: 99 mg/dL (ref 70–99)
POTASSIUM: 4 mmol/L (ref 3.5–5.1)
Sodium: 141 mmol/L (ref 135–145)

## 2018-08-06 LAB — CBC
HEMATOCRIT: 39.7 % (ref 36.0–46.0)
HEMOGLOBIN: 12.4 g/dL (ref 12.0–15.0)
MCH: 31.2 pg (ref 26.0–34.0)
MCHC: 31.2 g/dL (ref 30.0–36.0)
MCV: 100 fL (ref 80.0–100.0)
NRBC: 0 % (ref 0.0–0.2)
Platelets: 190 10*3/uL (ref 150–400)
RBC: 3.97 MIL/uL (ref 3.87–5.11)
RDW: 12.2 % (ref 11.5–15.5)
WBC: 10 10*3/uL (ref 4.0–10.5)

## 2018-08-06 NOTE — Evaluation (Signed)
Physical Therapy Evaluation Patient Details Name: Diana Meadows MRN: [id number] DOB: [birth year] Today's Date: 08/06/2018   History of Present Illness  Pt is a 84 y/o F who presented with abdominal pain, nausea, and vomiting.  Pt found to have SBO.  Pt with h/o multiple prior abdominal surgeries and subsequent adhesions with multiple small bowel obstructions.  Pt's PMH includes kyphoplasty, COPD, cataracts, osteoporosis.    Clinical Impression  Pt admitted with above diagnosis. Pt currently with functional limitations due to the deficits listed below (see PT Problem List). Pt was agreeable to PT evaluation.  Diana Meadows is independent ambulating without AD at baseline.  Today she demonstrates unsteadiness and guarded posture while ambulating with decreased gait speed without AD.  With introduction of RW pt's balance improves and pt is even able to perform head turns without instability. SpO2 85-88% on RA at rest even after pursed lip breathing in sitting.  O2 increased to 1L with no improvement.  Increased to 2L O2 and pt's SpO2 remains at or above 95% on 2L O2 for remainder of session.  Pt will benefit from skilled PT to increase their independence and safety with mobility to allow discharge to the venue listed below.      Follow Up Recommendations Home health PT(to address balance )    Equipment Recommendations  None recommended by PT    Recommendations for Other Services       Precautions / Restrictions Precautions Precautions: Fall;Other (comment) Precaution Comments: O2 Restrictions Weight Bearing Restrictions: No      Mobility  Bed Mobility               General bed mobility comments: Pt sitting in chair at start and end of session  Transfers Overall transfer level: Modified independent Equipment used: None Transfers: Sit to/from Stand Sit to Stand: Modified independent (Device/Increase time)         General transfer comment: No sign of instability but slow to  rise  Ambulation/Gait Ambulation/Gait assistance: Min guard Gait Distance (Feet): 160 Feet Assistive device: None;Rolling walker (2 wheeled) Gait Pattern/deviations: Step-through pattern;Decreased step length - right;Decreased step length - left     General Gait Details: Pt ambulates ~80 ft without AD and demonstrates guarded posture with decreased gait speed.  Pt reaches out for railing on wall x1 for support.  RW introduced and pt ambulated 80 ft with improved stability and was able to perform horizontal head turns without LOB.  SpO2 remains at or above 95% on 2L O2 while ambulating.   Stairs            Wheelchair Mobility    Modified Rankin (Stroke Patients Only)       Balance Overall balance assessment: Needs assistance Sitting-balance support: No upper extremity supported;Feet supported Sitting balance-Leahy Scale: Good     Standing balance support: No upper extremity supported;During functional activity Standing balance-Leahy Scale: Fair Standing balance comment: Pt able to stand statically and ambulate without UE support but benefits from use of RW to ambulate for improved stability.                               Pertinent Vitals/Pain Pain Assessment: Faces Faces Pain Scale: Hurts a little bit Pain Location: ache along R flank Pain Descriptors / Indicators: Aching Pain Intervention(s): Limited activity within patient's tolerance;Monitored during session;Repositioned    Home Living Family/patient expects to be discharged to:: Assisted living  Home Equipment: Walker - 2 wheels;Shower seat - built in;Grab bars - toilet;Grab bars - tub/shower Additional Comments: Pt's apartment at ALF is handicap accesible.     Prior Function Level of Independence: Independent         Comments: Pt ambulates without AD.  No recent falls.  Pt ind with ADLs.  Pt no longer driving due to impaired vision and hearing.  On 2L O2 at night at baseline,  no O2 during day.      Hand Dominance        Extremity/Trunk Assessment   Upper Extremity Assessment Upper Extremity Assessment: (BUE strength grossly 4/5)    Lower Extremity Assessment Lower Extremity Assessment: (BLE strength grossly 4-/5)    Cervical / Trunk Assessment Cervical / Trunk Assessment: Other exceptions Cervical / Trunk Exceptions: achiness along R flank  Communication   Communication: HOH(has hearing aids)  Cognition Arousal/Alertness: Awake/alert Behavior During Therapy: WFL for tasks assessed/performed Overall Cognitive Status: Within Functional Limits for tasks assessed                                        General Comments General comments (skin integrity, edema, etc.): SpO2 85-88% on RA at rest even after pursed lip breathing in sitting.  O2 increased to 1L with no improvement.  Increased to 2L O2 and pt's SpO2 remains at or above 95% on 2L O2 for remainder of session.     Exercises Other Exercises Other Exercises: Instructed pt in pursed lip breathing with cues to avoid use of accessory muscles    Assessment/Plan    PT Assessment Patient needs continued PT services  PT Problem List Decreased strength;Decreased balance;Decreased knowledge of use of DME;Decreased safety awareness;Cardiopulmonary status limiting activity;Pain       PT Treatment Interventions DME instruction;Gait training;Functional mobility training;Therapeutic activities;Therapeutic exercise;Balance training;Neuromuscular re-education;Patient/family education    PT Goals (Current goals can be found in the Care Plan section)  Acute Rehab PT Goals Patient Stated Goal: to improve strength and get back to PLOF PT Goal Formulation: With patient Time For Goal Achievement: 08/20/18 Potential to Achieve Goals: Good    Frequency Min 2X/week   Barriers to discharge        Co-evaluation               AM-PAC PT "6 Clicks" Mobility  Outcome Measure Help needed  turning from your back to your side while in a flat bed without using bedrails?: None Help needed moving from lying on your back to sitting on the side of a flat bed without using bedrails?: None Help needed moving to and from a bed to a chair (including a wheelchair)?: A Little Help needed standing up from a chair using your arms (e.g., wheelchair or bedside chair)?: A Little Help needed to walk in hospital room?: A Little Help needed climbing 3-5 steps with a railing? : A Little 6 Click Score: 20    End of Session Equipment Utilized During Treatment: Gait belt;Oxygen Activity Tolerance: Patient tolerated treatment well Patient left: in chair;with nursing/sitter in room(NT in room to assist pt to the bathroom) Nurse Communication: Mobility status;Other (comment)(SpO2) PT Visit Diagnosis: Unsteadiness on feet (R26.81);Other abnormalities of gait and mobility (R26.89);Muscle weakness (generalized) (M62.81)    Time: 1610-9604 PT Time Calculation (min) (ACUTE ONLY): 41 min   Charges:   PT Evaluation $PT Eval Low Complexity: 1 Low PT Treatments $Gait Training:  8-22 mins $Therapeutic Activity: 8-22 mins        Session was performed by student PT, Lisbeth RenshawShane Courtney, and directed, overseen, and documented by this PT.  Encarnacion ChuAshley Jashaun Penrose PT, DPT 08/06/2018, 12:30 PM

## 2018-08-06 NOTE — Care Management (Signed)
RNCM confirmed with Brad from [facility] that patient has orders for continuous O2.  Patient has concentrator with home fill unit, along with 2 portable tanks that can be filled at the facility.

## 2018-08-06 NOTE — Progress Notes (Signed)
Subjective/Chief Complaint: CT yesterday shows resolution of pSBO. Tolerated sips of clear liquids.  Abdominal pain resolved, but with left chest wall pain (suggestive of intercostal neuralgia).  Interested in advancing diet.   Objective: Vital signs in last 24 hours: Temp:  [98 F (36.7 C)-98.7 F (37.1 C)] 98 F (36.7 C) (11/27 0515) Pulse Rate:  [74-89] 74 (11/27 0515) Resp:  [16-18] 18 (11/27 0515) BP: (130-166)/(60-79) 166/79 (11/27 0515) SpO2:  [86 %-100 %] 100 % (11/27 0515) Last BM Date: 08/03/18  Intake/Output from previous day: 11/26 0701 - 11/27 0700 In: 1693 [P.O.:60; I.V.:1633] Out: 800 [Urine:800] Intake/Output this shift: Total I/O In: 123.4 [I.V.:123.4] Out: -   General appearance: alert, cooperative and no distress GI: soft, non-tender; no masses,  no organomegaly; ventral hernia present, but reducible  Lab Results:  Recent Labs    08/05/18 0603 08/06/18 0432  WBC 14.8* 10.0  HGB 12.6 12.4  HCT 40.6 39.7  PLT 215 190   BMET Recent Labs    08/05/18 0603 08/06/18 0432  NA 140 141  K 4.1 4.0  CL 103 104  CO2 32 33*  GLUCOSE 121* 99  BUN 17 12  CREATININE 0.69 0.56  CALCIUM 8.4* 8.0*   PT/INR No results for input(s): LABPROT, INR in the last 72 hours. ABG No results for input(s): PHART, HCO3 in the last 72 hours.  Invalid input(s): PCO2, PO2  Studies/Results: Dg Abd 1 View  Result Date: 08/04/2018 CLINICAL DATA:  Status post nasogastric tube placement. EXAM: ABDOMEN - 1 VIEW COMPARISON:  KUB of April 02, 2018 FINDINGS: The nasogastric tube proximal port projects above the GE junction with the tip just in the carina.The bowel gas pattern is relatively nonspecific. There is contrast material in the urinary bladder with small amount remaining in the renal collecting systems. The patient has undergone previous kyphoplasty at L3. Incidental note is made of patchy increased density at the left lung base more conspicuous than on yesterday's  study. IMPRESSION: High positioning of the nasogastric tube. Advancement by 10 cm is needed. Probable atelectasis or developing pneumonia in the lingula or left lower lobe. Electronically Signed   By: David  SwazilandJordan M.D.   On: 08/04/2018 10:21   Ct Abdomen Pelvis W Contrast  Result Date: 08/05/2018 CLINICAL DATA:  Abdominal distention, follow-up of small-bowel obstruction, EXAM: CT ABDOMEN AND PELVIS WITH CONTRAST TECHNIQUE: Multidetector CT imaging of the abdomen and pelvis was performed using the standard protocol following bolus administration of intravenous contrast. CONTRAST:  100mL ISOVUE-300 IOPAMIDOL (ISOVUE-300) INJECTION 61% COMPARISON:  CT abdomen pelvis of 08/03/2017 FINDINGS: Lower chest: There is some parenchymal opacity at the left lower lobe suspicious for developing pneumonia. Also there may be a tiny left pleural effusion present. Moderate size hiatal hernia is again noted. The heart is mildly enlarged and stable. Hepatobiliary: The liver enhances with no focal abnormality and no ductal dilatation is seen. Surgical clips are present from prior cholecystectomy. Pancreas: The pancreas is somewhat fatty infiltrated but no mass or inflammation is evident. The pancreatic duct is not dilated. Spleen: The spleen is unremarkable. Adrenals/Urinary Tract: The adrenal glands appear normal. The kidneys enhance and there are multiple cysts present left more numerous and larger than on the right. The largest left renal cyst is in the lower pole anteriorly measuring 5.2 cm. The ureters appear normal in caliber. The urinary bladder is moderately distended with no abnormality noted. Stomach/Bowel: As noted above there is a moderate size hiatal hernia present. The remainder of the  stomach is not well distended and NG tube tip is noted in the distal antrum of the stomach. No present small-bowel obstruction is seen. Multiple rectosigmoid colonic diverticula are noted the mid transverse colon does extend into and  midline ventral hernia without current obstruction. The terminal ileum is unremarkable. The appendix is within upper limits of normal but no inflammatory process is seen within the right lower quadrant. Vascular/Lymphatic: There is significant abdominal aortic atherosclerosis present and the abdominal aorta is ectatic as are the common iliac arteries. No adenopathy is seen. Reproductive: The uterus appears to have been resected. No adnexal lesion is seen. No fluid or mass is seen within the pelvis. Other: None. Musculoskeletal: The lumbar vertebrae remain in normal alignment. Vertebral augmentation at L3 is noted and there is degenerative disc disease particularly at L5-S1. IMPRESSION: 1. Parenchymal opacity in the left lower lobe suspicious for developing pneumonia. Also question of a tiny left pleural effusion. 2. Moderate size hiatal hernia.  No small bowel obstruction is seen. 3. No change in midline ventral hernia containing a portion of the transverse colon which is nondilated. 4. Significant abdominal aortic atherosclerosis. Electronically Signed   By: Dwyane Dee M.D.   On: 08/05/2018 15:06   Dg Abd Portable 2v  Result Date: 08/05/2018 CLINICAL DATA:  Small bowel obstruction EXAM: PORTABLE ABDOMEN - 2 VIEW COMPARISON:  08/04/2018 FINDINGS: NG tube is in the proximal stomach. Gas throughout large and small bowel. No convincing evidence for small bowel obstruction currently. Prior cholecystectomy. No free air. IMPRESSION: Gas throughout nondistended large and small bowel. No current evidence for small bowel obstruction. Electronically Signed   By: Charlett Nose M.D.   On: 08/05/2018 10:22    Anti-infectives: Anti-infectives (From admission, onward)   None      Assessment/Plan: s/p * No surgery found * Advance diet Plan for discharge tomorrow or Friday, pending diet tolerance  LOS: 3 days    Duanne Guess 08/06/2018

## 2018-08-06 NOTE — Progress Notes (Signed)
MD aware of fever, and right upper abdominal quad.New order initiated.

## 2018-08-06 NOTE — Care Management (Signed)
SATURATION QUALIFICATIONS: (This note is used to comply with regulatory documentation for home oxygen)  Patient Saturations on Room Air at Rest = 85%  Patient Saturations on 2 Liters of oxygen while at Rest 95%  Patient Saturations on 2 Liters of oxygen while ambulating 95%  Information obtained by PT during evaluation.

## 2018-08-06 NOTE — Care Management Important Message (Signed)
Copy of signed IM left with patient in room.  

## 2018-08-07 LAB — CBC
HEMATOCRIT: 40.4 % (ref 36.0–46.0)
Hemoglobin: 12.5 g/dL (ref 12.0–15.0)
MCH: 31 pg (ref 26.0–34.0)
MCHC: 30.9 g/dL (ref 30.0–36.0)
MCV: 100.2 fL — ABNORMAL HIGH (ref 80.0–100.0)
Platelets: 246 10*3/uL (ref 150–400)
RBC: 4.03 MIL/uL (ref 3.87–5.11)
RDW: 12 % (ref 11.5–15.5)
WBC: 8.6 10*3/uL (ref 4.0–10.5)
nRBC: 0 % (ref 0.0–0.2)

## 2018-08-07 LAB — BASIC METABOLIC PANEL
Anion gap: 9 (ref 5–15)
BUN: 10 mg/dL (ref 8–23)
CO2: 28 mmol/L (ref 22–32)
Calcium: 8.3 mg/dL — ABNORMAL LOW (ref 8.9–10.3)
Chloride: 102 mmol/L (ref 98–111)
Creatinine, Ser: 0.66 mg/dL (ref 0.44–1.00)
GFR calc Af Amer: >60 mL/min (ref >60)
GFR calc non Af Amer: >60 mL/min (ref >60)
Glucose, Bld: 88 mg/dL (ref 70–99)
POTASSIUM: 4.1 mmol/L (ref 3.5–5.1)
Sodium: 139 mmol/L (ref 135–145)

## 2018-08-07 LAB — PHOSPHORUS: PHOSPHORUS: 3 mg/dL (ref 2.5–4.6)

## 2018-08-07 LAB — MAGNESIUM: Magnesium: 1.9 mg/dL (ref 1.7–2.4)

## 2018-08-07 MED ORDER — IBUPROFEN 400 MG PO TABS
600.0000 mg | ORAL_TABLET | Freq: Four times a day (QID) | ORAL | Status: DC
  Administered 2018-08-07 – 2018-08-08 (×4): 600 mg via ORAL
  Filled 2018-08-07 (×4): qty 2

## 2018-08-07 NOTE — Progress Notes (Signed)
Subjective/Chief Complaint: Passing flatus and tolerated full liquid diet. Had low-grade fever overnight; CXR suggested possible developing lingular pneumonia, however WBC normal and fever resolved.  Abdominal pain resolved, but with left chest wall pain (suggestive of intercostal neuralgia).    Objective: Vital signs in last 24 hours: Temp:  [98.3 F (36.8 C)-101 F (38.3 C)] 99 F (37.2 C) (11/28 0543) Pulse Rate:  [59-90] 80 (11/28 0543) Resp:  [20] 20 (11/28 0543) BP: (131-146)/(60-63) 134/63 (11/28 0543) SpO2:  [98 %-100 %] 98 % (11/28 0543) Last BM Date: 08/06/18  Intake/Output from previous day: 11/27 0701 - 11/28 0700 In: 1162.1 [P.O.:900; I.V.:262.1] Out: -  Intake/Output this shift: No intake/output data recorded.  General appearance: alert, cooperative and no distress Chest: right chest wall tenderness with palpation GI: soft, non-tender; no masses,  no organomegaly; ventral hernia present, but reducible  Lab Results:  Recent Labs    08/06/18 0432 08/07/18 0451  WBC 10.0 8.6  HGB 12.4 12.5  HCT 39.7 40.4  PLT 190 246   BMET Recent Labs    08/06/18 0432 08/07/18 0451  NA 141 139  K 4.0 4.1  CL 104 102  CO2 33* 28  GLUCOSE 99 88  BUN 12 10  CREATININE 0.56 0.66  CALCIUM 8.0* 8.3*   PT/INR No results for input(s): LABPROT, INR in the last 72 hours. ABG No results for input(s): PHART, HCO3 in the last 72 hours.  Invalid input(s): PCO2, PO2  Studies/Results: Dg Chest 2 View  Result Date: 08/06/2018 CLINICAL DATA:  Oxygen desaturation EXAM: CHEST - 2 VIEW COMPARISON:  08/03/2018 chest radiograph. FINDINGS: Stable cardiomediastinal silhouette with normal heart size and small to moderate hiatal hernia. No pneumothorax. Small left pleural effusion. No right pleural effusion. Hyperinflated lungs. Patchy lingular opacity. IMPRESSION: 1. Patchy lingular opacity suggesting pneumonia. 2. New small left pleural effusion. 3. Hyperinflated lungs,  suggesting COPD. 4. Follow-up chest radiographs recommended to document resolution. Electronically Signed   By: Delbert PhenixJason A Poff M.D.   On: 08/06/2018 21:51   Ct Abdomen Pelvis W Contrast  Result Date: 08/05/2018 CLINICAL DATA:  Abdominal distention, follow-up of small-bowel obstruction, EXAM: CT ABDOMEN AND PELVIS WITH CONTRAST TECHNIQUE: Multidetector CT imaging of the abdomen and pelvis was performed using the standard protocol following bolus administration of intravenous contrast. CONTRAST:  100mL ISOVUE-300 IOPAMIDOL (ISOVUE-300) INJECTION 61% COMPARISON:  CT abdomen pelvis of 08/03/2017 FINDINGS: Lower chest: There is some parenchymal opacity at the left lower lobe suspicious for developing pneumonia. Also there may be a tiny left pleural effusion present. Moderate size hiatal hernia is again noted. The heart is mildly enlarged and stable. Hepatobiliary: The liver enhances with no focal abnormality and no ductal dilatation is seen. Surgical clips are present from prior cholecystectomy. Pancreas: The pancreas is somewhat fatty infiltrated but no mass or inflammation is evident. The pancreatic duct is not dilated. Spleen: The spleen is unremarkable. Adrenals/Urinary Tract: The adrenal glands appear normal. The kidneys enhance and there are multiple cysts present left more numerous and larger than on the right. The largest left renal cyst is in the lower pole anteriorly measuring 5.2 cm. The ureters appear normal in caliber. The urinary bladder is moderately distended with no abnormality noted. Stomach/Bowel: As noted above there is a moderate size hiatal hernia present. The remainder of the stomach is not well distended and NG tube tip is noted in the distal antrum of the stomach. No present small-bowel obstruction is seen. Multiple rectosigmoid colonic diverticula are noted the mid  transverse colon does extend into and midline ventral hernia without current obstruction. The terminal ileum is unremarkable. The  appendix is within upper limits of normal but no inflammatory process is seen within the right lower quadrant. Vascular/Lymphatic: There is significant abdominal aortic atherosclerosis present and the abdominal aorta is ectatic as are the common iliac arteries. No adenopathy is seen. Reproductive: The uterus appears to have been resected. No adnexal lesion is seen. No fluid or mass is seen within the pelvis. Other: None. Musculoskeletal: The lumbar vertebrae remain in normal alignment. Vertebral augmentation at L3 is noted and there is degenerative disc disease particularly at L5-S1. IMPRESSION: 1. Parenchymal opacity in the left lower lobe suspicious for developing pneumonia. Also question of a tiny left pleural effusion. 2. Moderate size hiatal hernia.  No small bowel obstruction is seen. 3. No change in midline ventral hernia containing a portion of the transverse colon which is nondilated. 4. Significant abdominal aortic atherosclerosis. Electronically Signed   By: Dwyane Dee M.D.   On: 08/05/2018 15:06   Dg Abd Portable 2v  Result Date: 08/05/2018 CLINICAL DATA:  Small bowel obstruction EXAM: PORTABLE ABDOMEN - 2 VIEW COMPARISON:  08/04/2018 FINDINGS: NG tube is in the proximal stomach. Gas throughout large and small bowel. No convincing evidence for small bowel obstruction currently. Prior cholecystectomy. No free air. IMPRESSION: Gas throughout nondistended large and small bowel. No current evidence for small bowel obstruction. Electronically Signed   By: Charlett Nose M.D.   On: 08/05/2018 10:22    Anti-infectives: Anti-infectives (From admission, onward)   None      Assessment/Plan: s/p * No surgery found * Advance diet to soft Chest wall pain likely musculoskeletal, though she denies falls/trauma. Continue gabapentin, start scheduled ibuprofen. May d/c over the weekend, if her assisted living facility can take her and DME/home PT available.  LOS: 4 days    Diana Meadows 08/07/2018

## 2018-08-08 MED ORDER — LIDOCAINE 5 % EX PTCH
1.0000 | MEDICATED_PATCH | CUTANEOUS | Status: DC
  Filled 2018-08-08: qty 1

## 2018-08-08 MED ORDER — LIDOCAINE 5 % EX PTCH: 1.0000 | MEDICATED_PATCH | CUTANEOUS | 0 refills | Status: DC

## 2018-08-08 MED ORDER — GABAPENTIN 600 MG PO TABS: 300.0000 mg | ORAL_TABLET | Freq: Three times a day (TID) | ORAL | 0 refills | Status: AC

## 2018-08-08 NOTE — Clinical Social Work Note (Signed)
[facility] medically [facility]. CSW notified patient and daughter Filiberto PinksSandy Jackson [phone]. Daughter will transport patient this afternoon. CSW completed discharge packet for ALF.   Ruthe Mannanandace July Linam MSW, [address]CSWA [phone]

## 2018-08-08 NOTE — Discharge Summary (Signed)
Patient ID: KAILENA LUBAS MRN: [id number] DOB/AGE: 1919/07/04 84 y.o.  Admit date: 08/03/2018 Discharge date: 08/08/2018   Discharge Diagnoses:  Active Problems:   Partial small bowel obstruction (HCC)   Procedures:  None  Hospital Course: Patient was admitted on 11/24 with a partial SBO, which has been treated conservatively with NG tube placement.  Her NG was removed when she regained bowel function and her diet was slowly advanced.  She is ambulating with assistance and will have physical therapy as outpatient.  She is tolerating a diet, having bowel movement, without any abdominal pain, and ready for discharge.  On exam, she is in no acute distress, with stable vital signs.  Her abdomen is soft, nondistended, nontender.  Consults:  PT  Disposition: Discharge disposition: 01-Home or Self Care       Discharge Instructions    Call MD for:  difficulty breathing, headache or visual disturbances   Complete by:  As directed    Call MD for:  persistant nausea and vomiting   Complete by:  As directed    Call MD for:  redness, tenderness, or signs of infection (pain, swelling, redness, odor or green/yellow discharge around incision site)   Complete by:  As directed    Call MD for:  severe uncontrolled pain   Complete by:  As directed    Call MD for:  temperature >100.4   Complete by:  As directed    Diet - low sodium heart healthy   Complete by:  As directed    Discharge instructions   Complete by:  As directed    1.  Follow up with your PCP for management of your right subcostal pain and follow up from this hospitalization. 2.  Regular diet as tolerated. 3.  Physical therapy as indicated.   Increase activity slowly   Complete by:  As directed      Allergies as of 08/08/2018      Reactions   Codeine Other (See Comments)   Upset stomach and constipation   Novocain [procaine] Other (See Comments)   ineffective   Nsaids Other (See Comments)   Reaction: Upset stomach       Medication List    STOP taking these medications   traMADol 50 MG tablet Commonly known as:  ULTRAM     TAKE these medications   acetaminophen 500 MG tablet Commonly known as:  TYLENOL Take 1,000 mg by mouth 2 (two) times daily.   albuterol 108 (90 Base) MCG/ACT inhaler Commonly known as:  PROVENTIL HFA;VENTOLIN HFA Inhale 2 puffs into the lungs every 6 (six) hours as needed for wheezing or shortness of breath.   Cholecalciferol 25 MCG (1000 UT) Tbdp Take 1,000 Units by mouth daily.   cyclobenzaprine 5 MG tablet Commonly known as:  FLEXERIL Take 5 mg by mouth 3 (three) times daily as needed for muscle spasms.   estradiol 0.5 MG tablet Commonly known as:  ESTRACE Take 1 tablet by mouth daily.   fluticasone 50 MCG/ACT nasal spray Commonly known as:  FLONASE Place 1 spray into both nostrils 2 (two) times daily.   furosemide 20 MG tablet Commonly known as:  LASIX Take 20 mg by mouth daily.   gabapentin 600 MG tablet Commonly known as:  NEURONTIN Take 0.5 tablets (300 mg total) by mouth 3 (three) times daily.   lidocaine 5 % Commonly known as:  LIDODERM Place 1 patch onto the skin daily. Remove & Discard patch within 12 hours or as directed by  MD   Magnesium 250 MG Tabs Take 1 tablet by mouth daily.   OXYGEN Inhale 2 L into the lungs at bedtime.   polyethylene glycol packet Commonly known as:  MIRALAX / GLYCOLAX Take 17 g by mouth daily as needed for mild constipation.            Durable Medical Equipment  (From admission, onward)         Start     Ordered   08/06/18 1219  For home use only DME oxygen  Once    Question Answer Comment  Mode or (Route) Nasal cannula   Liters per Minute 2   Frequency Continuous (stationary and portable oxygen unit needed)   Oxygen conserving device No   Oxygen delivery system Gas      08/06/18 1219         Follow-up Information    Lauro RegulusAnderson, Marshall W, MD Follow up.   Specialty:  Internal  Medicine Contact information: [address] [facility] [place] [id number] [phone]

## 2018-08-08 NOTE — Care Management Note (Signed)
Case Management Note  Patient Details  Name: Diana Meadows MRN: [id number] Date of Birth: Jul 01, 1919  Subjective/Objective:  Patient to be discharged per MD order. Orders in place for home health services. [facility] They are not interested in further services. Patient had O2 set up previously but needed supplies. [facility] was able to ensure patient had 2 portable tanks as well as the concentrator.  Daughter to provide transport in private vehicle, there are no RNCM needs.                    Action/Plan:   Expected Discharge Date:  08/08/18               Expected Discharge Plan:     In-House Referral:     Discharge planning Services  CM Consult  Post Acute Care Choice:  Home Health, Durable Medical Equipment Choice offered to:  Patient, Adult Children  DME Arranged:    DME Agency:     HH Arranged:  Patient Refused HH HH Agency:     Status of Service:  Completed, signed off  If discussed at [employer] Length of Stay Meetings, dates discussed:    Additional Comments:  Virgel ManifoldJosh A Taylore Hinde, RN 08/08/2018, 2:01 PM

## 2018-08-08 NOTE — Care Management Important Message (Signed)
Copy of signed IM left with patient in room.  

## 2018-08-08 NOTE — Progress Notes (Addendum)
08/08/2018 2:09 PM  Diana Meadows to be D/C'd home per MD order.  Discussed prescriptions and follow up appointments with the patient. Prescriptions given to patient, medication list explained in detail. Pt verbalized understanding.  Allergies as of 08/08/2018      Reactions   Codeine Other (See Comments)   Upset stomach and constipation   Novocain [procaine] Other (See Comments)   ineffective   Nsaids Other (See Comments)   Reaction: Upset stomach      Medication List    STOP taking these medications   traMADol 50 MG tablet Commonly known as:  ULTRAM     TAKE these medications   acetaminophen 500 MG tablet Commonly known as:  TYLENOL Take 1,000 mg by mouth 2 (two) times daily.   albuterol 108 (90 Base) MCG/ACT inhaler Commonly known as:  PROVENTIL HFA;VENTOLIN HFA Inhale 2 puffs into the lungs every 6 (six) hours as needed for wheezing or shortness of breath.   Cholecalciferol 25 MCG (1000 UT) Tbdp Take 1,000 Units by mouth daily.   cyclobenzaprine 5 MG tablet Commonly known as:  FLEXERIL Take 5 mg by mouth 3 (three) times daily as needed for muscle spasms.   estradiol 0.5 MG tablet Commonly known as:  ESTRACE Take 1 tablet by mouth daily.   fluticasone 50 MCG/ACT nasal spray Commonly known as:  FLONASE Place 1 spray into both nostrils 2 (two) times daily.   furosemide 20 MG tablet Commonly known as:  LASIX Take 20 mg by mouth daily.   gabapentin 600 MG tablet Commonly known as:  NEURONTIN Take 0.5 tablets (300 mg total) by mouth 3 (three) times daily.   lidocaine 5 % Commonly known as:  LIDODERM Place 1 patch onto the skin daily. Remove & Discard patch within 12 hours or as directed by MD   Magnesium 250 MG Tabs Take 1 tablet by mouth daily.   OXYGEN Inhale 2 L into the lungs at bedtime.   polyethylene glycol packet Commonly known as:  MIRALAX / GLYCOLAX Take 17 g by mouth daily as needed for mild constipation.            Durable Medical  Equipment  (From admission, onward)         Start     Ordered   08/06/18 1219  For home use only DME oxygen  Once    Question Answer Comment  Mode or (Route) Nasal cannula   Liters per Minute 2   Frequency Continuous (stationary and portable oxygen unit needed)   Oxygen conserving device No   Oxygen delivery system Gas      08/06/18 1219          Vitals:   08/08/18 0430 08/08/18 1151  BP: (!) 143/69 (!) 126/50  Pulse: 73 75  Resp: 20 19  Temp: 97.6 F (36.4 C) 98.3 F (36.8 C)  SpO2: 99% 99%    Skin clean, dry and intact without evidence of skin break down, no evidence of skin tears noted. IV catheter discontinued intact. Site without signs and symptoms of complications. Dressing and pressure applied. Pt denies pain at this time. No complaints noted.  An After Visit Summary was printed and given to the patient. Patient escorted via WC, and D/C home via private auto.  Diana RenoMisty D [name], RN

## 2018-09-05 ENCOUNTER — Encounter: Payer: Self-pay | Admitting: Emergency Medicine

## 2018-09-05 ENCOUNTER — Inpatient Hospital Stay
Admission: EM | Admit: 2018-09-05 | Discharge: 2018-09-08 | DRG: 871 | Disposition: A | Discharge status: Home Health | Payer: Medicare Other | Attending: Specialist | Admitting: Specialist

## 2018-09-05 ENCOUNTER — Emergency Department: Payer: Medicare Other

## 2018-09-05 ENCOUNTER — Other Ambulatory Visit: Payer: Self-pay

## 2018-09-05 DIAGNOSIS — A419 Sepsis, unspecified organism: Principal | ICD-10-CM | POA: Diagnosis present

## 2018-09-05 DIAGNOSIS — J440 Chronic obstructive pulmonary disease with acute lower respiratory infection: Secondary | ICD-10-CM | POA: Diagnosis present

## 2018-09-05 DIAGNOSIS — Z807 Family history of other malignant neoplasms of lymphoid, hematopoietic and related tissues: Secondary | ICD-10-CM | POA: Diagnosis not present

## 2018-09-05 DIAGNOSIS — J441 Chronic obstructive pulmonary disease with (acute) exacerbation: Secondary | ICD-10-CM | POA: Diagnosis present

## 2018-09-05 DIAGNOSIS — Z886 Allergy status to analgesic agent status: Secondary | ICD-10-CM

## 2018-09-05 DIAGNOSIS — Z9841 Cataract extraction status, right eye: Secondary | ICD-10-CM | POA: Diagnosis not present

## 2018-09-05 DIAGNOSIS — Z90710 Acquired absence of both cervix and uterus: Secondary | ICD-10-CM

## 2018-09-05 DIAGNOSIS — G629 Polyneuropathy, unspecified: Secondary | ICD-10-CM | POA: Diagnosis present

## 2018-09-05 DIAGNOSIS — Z9049 Acquired absence of other specified parts of digestive tract: Secondary | ICD-10-CM

## 2018-09-05 DIAGNOSIS — Z87891 Personal history of nicotine dependence: Secondary | ICD-10-CM | POA: Diagnosis not present

## 2018-09-05 DIAGNOSIS — K219 Gastro-esophageal reflux disease without esophagitis: Secondary | ICD-10-CM | POA: Diagnosis present

## 2018-09-05 DIAGNOSIS — Z831 Family history of other infectious and parasitic diseases: Secondary | ICD-10-CM | POA: Diagnosis not present

## 2018-09-05 DIAGNOSIS — J9621 Acute and chronic respiratory failure with hypoxia: Secondary | ICD-10-CM | POA: Diagnosis present

## 2018-09-05 DIAGNOSIS — Z8719 Personal history of other diseases of the digestive system: Secondary | ICD-10-CM

## 2018-09-05 DIAGNOSIS — M810 Age-related osteoporosis without current pathological fracture: Secondary | ICD-10-CM | POA: Diagnosis present

## 2018-09-05 DIAGNOSIS — Z888 Allergy status to other drugs, medicaments and biological substances status: Secondary | ICD-10-CM | POA: Diagnosis not present

## 2018-09-05 DIAGNOSIS — J9620 Acute and chronic respiratory failure, unspecified whether with hypoxia or hypercapnia: Secondary | ICD-10-CM | POA: Diagnosis present

## 2018-09-05 DIAGNOSIS — J189 Pneumonia, unspecified organism: Secondary | ICD-10-CM | POA: Diagnosis present

## 2018-09-05 DIAGNOSIS — Z9842 Cataract extraction status, left eye: Secondary | ICD-10-CM

## 2018-09-05 DIAGNOSIS — Z9981 Dependence on supplemental oxygen: Secondary | ICD-10-CM

## 2018-09-05 DIAGNOSIS — E875 Hyperkalemia: Secondary | ICD-10-CM | POA: Diagnosis present

## 2018-09-05 DIAGNOSIS — Z7951 Long term (current) use of inhaled steroids: Secondary | ICD-10-CM

## 2018-09-05 DIAGNOSIS — N179 Acute kidney failure, unspecified: Secondary | ICD-10-CM | POA: Diagnosis present

## 2018-09-05 DIAGNOSIS — Z79890 Hormone replacement therapy (postmenopausal): Secondary | ICD-10-CM

## 2018-09-05 DIAGNOSIS — Z885 Allergy status to narcotic agent status: Secondary | ICD-10-CM

## 2018-09-05 DIAGNOSIS — M1990 Unspecified osteoarthritis, unspecified site: Secondary | ICD-10-CM | POA: Diagnosis present

## 2018-09-05 DIAGNOSIS — I10 Essential (primary) hypertension: Secondary | ICD-10-CM | POA: Diagnosis present

## 2018-09-05 LAB — CBC
HCT: 43.7 % (ref 36.0–46.0)
Hemoglobin: 13.4 g/dL (ref 12.0–15.0)
MCH: 30.6 pg (ref 26.0–34.0)
MCHC: 30.7 g/dL (ref 30.0–36.0)
MCV: 99.8 fL (ref 80.0–100.0)
NRBC: 0 % (ref 0.0–0.2)
Platelets: 279 10*3/uL (ref 150–400)
RBC: 4.38 MIL/uL (ref 3.87–5.11)
RDW: 12.3 % (ref 11.5–15.5)
WBC: 20.4 10*3/uL — AB (ref 4.0–10.5)

## 2018-09-05 LAB — LACTIC ACID, PLASMA: LACTIC ACID, VENOUS: 1.3 mmol/L (ref 0.5–1.9)

## 2018-09-05 LAB — BASIC METABOLIC PANEL
Anion gap: 8 (ref 5–15)
BUN: 23 mg/dL (ref 8–23)
CO2: 35 mmol/L — ABNORMAL HIGH (ref 22–32)
Calcium: 9 mg/dL (ref 8.9–10.3)
Chloride: 95 mmol/L — ABNORMAL LOW (ref 98–111)
Creatinine, Ser: 1.02 mg/dL — ABNORMAL HIGH (ref 0.44–1.00)
GFR calc Af Amer: 53 mL/min — ABNORMAL LOW (ref >60)
GFR calc non Af Amer: 46 mL/min — ABNORMAL LOW (ref >60)
Glucose, Bld: 128 mg/dL — ABNORMAL HIGH (ref 70–99)
POTASSIUM: 4.6 mmol/L (ref 3.5–5.1)
Sodium: 138 mmol/L (ref 135–145)

## 2018-09-05 LAB — PROTIME-INR
INR: 0.98
Prothrombin Time: 12.9 seconds (ref 11.4–15.2)

## 2018-09-05 LAB — INFLUENZA PANEL BY PCR (TYPE A & B)
Influenza A By PCR: NEGATIVE
Influenza B By PCR: NEGATIVE

## 2018-09-05 MED ORDER — METHYLPREDNISOLONE SODIUM SUCC 125 MG IJ SOLR
125.0000 mg | Freq: Once | INTRAMUSCULAR | Status: AC
  Administered 2018-09-05: 125 mg via INTRAVENOUS
  Filled 2018-09-05: qty 2

## 2018-09-05 MED ORDER — IPRATROPIUM-ALBUTEROL 0.5-2.5 (3) MG/3ML IN SOLN
3.0000 mL | Freq: Once | RESPIRATORY_TRACT | Status: AC
  Administered 2018-09-05: 3 mL via RESPIRATORY_TRACT
  Filled 2018-09-05: qty 3

## 2018-09-05 MED ORDER — METHYLPREDNISOLONE SODIUM SUCC 40 MG IJ SOLR
40.0000 mg | Freq: Four times a day (QID) | INTRAMUSCULAR | Status: DC
  Administered 2018-09-05 – 2018-09-07 (×7): 40 mg via INTRAVENOUS
  Filled 2018-09-05 (×7): qty 1

## 2018-09-05 MED ORDER — ACETAMINOPHEN 500 MG PO TABS
1000.0000 mg | ORAL_TABLET | Freq: Once | ORAL | Status: AC
  Administered 2018-09-05: 1000 mg via ORAL
  Filled 2018-09-05: qty 2

## 2018-09-05 MED ORDER — TRAZODONE HCL 50 MG PO TABS
25.0000 mg | ORAL_TABLET | Freq: Every evening | ORAL | Status: DC | PRN
  Administered 2018-09-06: 25 mg via ORAL
  Filled 2018-09-05: qty 1

## 2018-09-05 MED ORDER — SODIUM CHLORIDE 0.9 % IV SOLN
1.0000 g | Freq: Once | INTRAVENOUS | Status: AC
  Administered 2018-09-05: 1 g via INTRAVENOUS
  Filled 2018-09-05: qty 1

## 2018-09-05 MED ORDER — ONDANSETRON HCL 4 MG PO TABS: 4.0000 mg | ORAL_TABLET | Freq: Four times a day (QID) | ORAL | Status: DC | PRN

## 2018-09-05 MED ORDER — AZITHROMYCIN 500 MG PO TABS
500.0000 mg | ORAL_TABLET | Freq: Every day | ORAL | Status: AC
  Administered 2018-09-05: 500 mg via ORAL
  Filled 2018-09-05: qty 1

## 2018-09-05 MED ORDER — ACETAMINOPHEN 650 MG RE SUPP: 650.0000 mg | Freq: Four times a day (QID) | RECTAL | Status: DC | PRN

## 2018-09-05 MED ORDER — HYDROCODONE-ACETAMINOPHEN 5-325 MG PO TABS: 1.0000 | ORAL_TABLET | ORAL | Status: DC | PRN

## 2018-09-05 MED ORDER — SODIUM CHLORIDE 0.9 % IV BOLUS
1500.0000 mL | Freq: Once | INTRAVENOUS | Status: AC
  Administered 2018-09-05: 1500 mL via INTRAVENOUS

## 2018-09-05 MED ORDER — VANCOMYCIN HCL IN DEXTROSE 1-5 GM/200ML-% IV SOLN
1000.0000 mg | Freq: Once | INTRAVENOUS | Status: AC
  Administered 2018-09-05: 1000 mg via INTRAVENOUS
  Filled 2018-09-05: qty 200

## 2018-09-05 MED ORDER — GABAPENTIN 300 MG PO CAPS
300.0000 mg | ORAL_CAPSULE | Freq: Three times a day (TID) | ORAL | Status: DC
  Administered 2018-09-05 – 2018-09-08 (×7): 300 mg via ORAL
  Filled 2018-09-05 (×8): qty 1

## 2018-09-05 MED ORDER — BISACODYL 5 MG PO TBEC
5.0000 mg | DELAYED_RELEASE_TABLET | Freq: Every day | ORAL | Status: DC | PRN
  Filled 2018-09-05: qty 1

## 2018-09-05 MED ORDER — DOCUSATE SODIUM 100 MG PO CAPS
100.0000 mg | ORAL_CAPSULE | Freq: Two times a day (BID) | ORAL | Status: DC
  Administered 2018-09-05 – 2018-09-07 (×5): 100 mg via ORAL
  Filled 2018-09-05 (×5): qty 1

## 2018-09-05 MED ORDER — IPRATROPIUM-ALBUTEROL 0.5-2.5 (3) MG/3ML IN SOLN
3.0000 mL | Freq: Four times a day (QID) | RESPIRATORY_TRACT | Status: DC
  Administered 2018-09-05 – 2018-09-06 (×5): 3 mL via RESPIRATORY_TRACT
  Filled 2018-09-05 (×5): qty 3

## 2018-09-05 MED ORDER — AZITHROMYCIN 250 MG PO TABS
250.0000 mg | ORAL_TABLET | Freq: Every day | ORAL | Status: DC
  Administered 2018-09-06 – 2018-09-08 (×3): 250 mg via ORAL
  Filled 2018-09-05 (×3): qty 1

## 2018-09-05 MED ORDER — SODIUM CHLORIDE 0.9 % IV SOLN
1.0000 g | INTRAVENOUS | Status: DC
  Administered 2018-09-06 – 2018-09-08 (×3): 1 g via INTRAVENOUS
  Filled 2018-09-05 (×3): qty 1
  Filled 2018-09-05: qty 10

## 2018-09-05 MED ORDER — ACETAMINOPHEN 325 MG PO TABS: 650.0000 mg | ORAL_TABLET | Freq: Four times a day (QID) | ORAL | Status: DC | PRN

## 2018-09-05 MED ORDER — SODIUM CHLORIDE 0.9 % IV SOLN
INTRAVENOUS | Status: DC
  Administered 2018-09-05 – 2018-09-06 (×2): via INTRAVENOUS

## 2018-09-05 MED ORDER — ENOXAPARIN SODIUM 30 MG/0.3ML ~~LOC~~ SOLN
30.0000 mg | SUBCUTANEOUS | Status: DC
  Administered 2018-09-05: 30 mg via SUBCUTANEOUS
  Filled 2018-09-05: qty 0.3

## 2018-09-05 MED ORDER — ONDANSETRON HCL 4 MG/2ML IJ SOLN: 4.0000 mg | Freq: Four times a day (QID) | INTRAMUSCULAR | Status: DC | PRN

## 2018-09-05 NOTE — Progress Notes (Signed)
CODE SEPSIS - PHARMACY COMMUNICATION  **Broad Spectrum Antibiotics should be administered within 1 hour of Sepsis diagnosis**  Time Code Sepsis Called/Page Received: 13:58  Antibiotics Ordered: cefepime/vancomycin  Time of 1st antibiotic administration: 14:31  Additional action taken by pharmacy:   If necessary, Name of Provider/Nurse Contacted:     Carola FrostNathan A Tishana Clinkenbeard ,PharmD, BCPS Clinical Pharmacist  09/05/2018  1:58 PM

## 2018-09-05 NOTE — Progress Notes (Signed)
Anticoagulation monitoring(Lovenox):   84 yo female ordered Lovenox 40 mg Q24h  Filed Weights   09/05/18 1306  Weight: 160 lb (72.6 kg)   BMI    Lab Results  Component Value Date   CREATININE 1.02 (H) 09/05/2018   CREATININE 0.66 08/07/2018   CREATININE 0.56 08/06/2018   Estimated Creatinine Clearance: 29.4 mL/min (A) (by C-G formula based on SCr of 1.02 mg/dL (H)). Hemoglobin & Hematocrit     Component Value Date/Time   HGB 13.4 09/05/2018 1321   HGB 13.0 05/03/2014 0009   HCT 43.7 09/05/2018 1321   HCT 41.3 05/03/2014 0009     Per Protocol for Patient with estCrcl < 30 ml/min and BMI < 40, will transition to Lovenox 30 mg Q24h.

## 2018-09-05 NOTE — H&P (Signed)
[facility] [place] at [facility]   PATIENT NAME: Diana Meadows    MR#:  [id number]  DATE OF BIRTH:  22-Jun-1919  DATE OF ADMISSION:  09/05/2018  PRIMARY CARE PHYSICIAN: Lauro Regulus, MD   REQUESTING/REFERRING PHYSICIAN: Dr. Darden Dates  CHIEF COMPLAINT: Shortness of breath   Chief Complaint  Patient presents with  . Fever  . Weakness  . Shortness of Breath    HISTORY OF PRESENT ILLNESS:  Diana Meadows  is a 84 y.o. female with a known history of COPD on oxygen 2 L at night, sent because of worsening shortness of breath, cough, wheezing.  All comes from [facility] nursing [facility].  Patient noted to have low-grade fever, shortness of breath, cough with yellow phlegm for the past 4 days.  Patient usually uses oxygen at night but she has not used oxygen 24 hours for the past 2 days.  PAST MEDICAL HISTORY:   Past Medical History:  Diagnosis Date  . Acute pancreatitis 2013  . Arthritis   . Asthma   . Bowel obstruction (HCC)   . Cataract cortical, senile   . Cholelithiasis without obstruction   . Chronic constipation   . Colon polyp   . COPD (chronic obstructive pulmonary disease) (HCC)   . Depression   . GERD (gastroesophageal reflux disease)   . Hernia, abdominal   . Hypertension   . Osteoporosis     PAST SURGICAL HISTOIRY:   Past Surgical History:  Procedure Laterality Date  . ABDOMINAL HYSTERECTOMY     BSO  . CATARACT EXTRACTION, BILATERAL Bilateral   . CESAREAN SECTION     x 3  . CHOLECYSTECTOMY    . COLONOSCOPY    . CYST EXCISION Right    axillary  . ESOPHAGOGASTRODUODENOSCOPY  05/01/2012  . EYE SURGERY    . HERNIA REPAIR     x3 inguinal  . HIATAL HERNIA REPAIR    . KYPHOPLASTY N/A 03/28/2018   Procedure: [id number];  Surgeon: Kennedy Bucker, MD;  Location: [facility] ORS;  Service: Orthopedics;  Laterality: N/A;  . POLYPECTOMY    . SEPTOPLASTY    . STAPEDECTOMY Right   . TONSILLECTOMY      SOCIAL HISTORY:   Social  History   Tobacco Use  . Smoking status: Former Smoker    Types: Cigarettes  . Smokeless tobacco: Never Used  Substance Use Topics  . Alcohol use: Yes    Comment: occassional    FAMILY HISTORY:   Family History  Problem Relation Age of Onset  . Lymphoma Mother   . Tuberculosis Father     DRUG ALLERGIES:   Allergies  Allergen Reactions  . Codeine Other (See Comments)    Upset stomach and constipation  . Novocain [Procaine] Other (See Comments)    ineffective  . Nsaids Other (See Comments)    Reaction: Upset stomach  . Tolmetin Other (See Comments)    Reaction: Upset stomach  . Tramadol-Acetaminophen Other (See Comments)    REVIEW OF SYSTEMS:  CONSTITUTIONAL: Noted to have fever, generalized weakness. EYES: No blurred or double vision.  EARS, NOSE, AND THROAT: No tinnitus or ear pain.  Patient is very hard of hearing RESPIRATORY: Cough, shortness of breath, wheezing CARDIOVASCULAR: No chest pain, orthopnea, edema.  GASTROINTESTINAL: No nausea, vomiting, diarrhea or abdominal pain.  GENITOURINARY: No dysuria, hematuria.  ENDOCRINE: No polyuria, nocturia,  HEMATOLOGY: No anemia, easy bruising or bleeding SKIN: No rash or lesion. MUSCULOSKELETAL: No joint pain or arthritis.   NEUROLOGIC: No tingling,  numbness, weakness.  PSYCHIATRY: No anxiety or depression.   MEDICATIONS AT HOME:   Prior to Admission medications   Medication Sig Start Date End Date Taking? Authorizing Provider  acetaminophen (TYLENOL) 500 MG tablet Take 1,000 mg by mouth every 12 (twelve) hours as needed for mild pain or fever.    Yes [provider]  albuterol (PROVENTIL HFA;VENTOLIN HFA) 108 (90 Base) MCG/ACT inhaler Inhale 2 puffs into the lungs every 6 (six) hours as needed for wheezing or shortness of breath.    Yes [provider]  Cholecalciferol 1000 units TBDP Take 1,000 Units by mouth daily.   Yes [provider]  estradiol (ESTRACE) 0.5 MG tablet Take 1 tablet  by mouth daily. 11/12/16  Yes [provider]  fluticasone (FLONASE) 50 MCG/ACT nasal spray Place 2 sprays into both nostrils daily as needed for allergies.    Yes [provider]  Fluticasone-Salmeterol (ADVAIR) 100-50 MCG/DOSE AEPB Inhale 1 puff into the lungs every 12 (twelve) hours.   Yes [provider]  gentamicin cream (GARAMYCIN) 0.1 % Apply 1 application topically as directed. Apply in the nose as directed   Yes [provider]  Magnesium 250 MG TABS Take 1 tablet by mouth daily.   Yes [provider]  Melatonin 3 MG TABS Take 3 mg by mouth at bedtime.   Yes [provider]  OXYGEN Inhale 2 L into the lungs at bedtime.   Yes [provider]  polyethylene glycol (MIRALAX / GLYCOLAX) packet Take 17 g by mouth daily as needed for mild constipation.   Yes [provider]  tiZANidine (ZANAFLEX) 2 MG tablet Take 2 mg by mouth every 8 (eight) hours as needed for muscle spasms.   Yes [provider]  gabapentin (NEURONTIN) 600 MG tablet Take 0.5 tablets (300 mg total) by mouth 3 (three) times daily. 08/08/18   Piscoya, Elita QuickJose, MD  lidocaine (LIDODERM) 5 % Place 1 patch onto the skin daily. Remove & Discard patch within 12 hours or as directed by MD 08/08/18   Henrene DodgePiscoya, Jose, MD      VITAL SIGNS:  Blood pressure (!) 119/47, pulse 95, temperature 100 F (37.8 C), temperature source Oral, resp. rate 18, height 5\' 2"  (1.575 m), weight 72.6 kg, SpO2 91 %.  PHYSICAL EXAMINATION:  GENERAL:  84 y.o.-year-old patient lying in the bed with no acute distress.  EYES: Pupils equal, round, reactive to light . No scleral icterus. Extraocular muscles intact.  HEENT: Head atraumatic, normocephalic. Oropharynx and nasopharynx clear.  NECK:  Supple, no jugular venous distention. No thyroid enlargement, no tenderness.  LUNGS: Diffuse expiratory wheezing in all lung fields. CARDIOVASCULAR: S1, S2 tachycardic.No murmurs, rubs, or gallops.   ABDOMEN: Soft, nontender, nondistended. Bowel sounds present. No organomegaly or mass.  EXTREMITIES: No pedal edema, cyanosis, or clubbing.  NEUROLOGIC: Cranial nerves II through XII are intact. Muscle strength 5/5 in all extremities. Sensation intact. Gait not checked.  PSYCHIATRIC: The patient is alert and oriented x 3.  SKIN: No obvious rash, lesion, or ulcer.   LABORATORY PANEL:   CBC Recent Labs  Lab 09/05/18 1321  WBC 20.4*  HGB 13.4  HCT 43.7  PLT 279   ------------------------------------------------------------------------------------------------------------------  Chemistries  Recent Labs  Lab 09/05/18 1321  NA 138  K 4.6  CL 95*  CO2 35*  GLUCOSE 128*  BUN 23  CREATININE 1.02*  CALCIUM 9.0   ------------------------------------------------------------------------------------------------------------------  Cardiac Enzymes No results for input(s): TROPONINI in the last 168 hours. ------------------------------------------------------------------------------------------------------------------  RADIOLOGY:  Dg Chest 2 View  Result Date: 09/05/2018 CLINICAL DATA:  Cough, short of breath, weakness EXAM: CHEST - 2 VIEW COMPARISON:  Chest x-ray of 08/06/2017 FINDINGS: There are somewhat prominent markings at the left lung base which may represent atelectasis, but pneumonia can not be excluded. In addition, there is mild cardiomegaly present and very mild pulmonary vascular congestion and not be excluded. No acute bony abnormality is seen. The bones are diffusely osteopenic. Vertebral augmentation has been performed in an upper lumbar vertebra. IMPRESSION: 1. Possible patchy pneumonia at the left lung base which may be located posteriorly. Recommend follow-up chest x-ray. 2. Cardiomegaly. Can not exclude very mild pulmonary vascular congestion. Electronically Signed   By: Dwyane DeePaul  Barry M.D.   On: 09/05/2018 13:48    EKG:   Sinus  tachycardia 100 bpm no ST-T  changes.  IMPRESSION AND PLAN:  84 year old female with history of COPD, current respiratory failure on 2 L of oxygen at night comes in from [place] because of worsening cough, shortness of breath, low-grade temperature, chest x-ray concerning for left lower lobe pneumonia. Assessment and plan 1.  Acute on chronic respiratory failure secondary to COPD exacerbation, pneumonia: Patient has evidence of sepsis with elevated white count, tachypnea: Continue oxygen, IV steroids, IV antibiotics, bronchodilators.  Patient received IV Solu-Medrol, series of nebulizers in the emergency room but still has a lot of wheezing.  On 2 L of oxygen, saturation is 99%.  But still tachycardic.  So continue rate monitoring, bronchodilators, IV steroids. 2.  Fever, respiratory distress: Flu test is pending. 3.  Acute Kidney injury: Continue IV fluids. 4.  Recent admission for small bowel obstruction, on November 24, continue stool softeners.  Abdomen is soft today.  All the records are reviewed and case discussed with ED provider. Management plans discussed with the patient, family and they are in agreement.  CODE STATUS: Full code  TOTAL TIME TAKING CARE OF THIS PATIENT: 55minutes.    Katha HammingSnehalatha Erasmo Vertz M.D on 09/05/2018 at 2:52 PM  Between 7am to 6pm - Pager - [phone]  After 6pm go to [email or web address] - password [email or web address] [facility]  [facility]  Office  [phone]  CC: Primary care physician; Lauro RegulusAnderson, Marshall W, MD  Note: This dictation was prepared with Dragon dictation along with smaller phrase technology. Any transcriptional errors that result from this process are unintentional.

## 2018-09-05 NOTE — ED Notes (Signed)
ONE SET OF BLOOD CULTURES SENT

## 2018-09-05 NOTE — ED Notes (Signed)
Patient reports being hungry, lunch box provided. Patient tolerated well.

## 2018-09-05 NOTE — ED Notes (Signed)
Unable to obtain 2nd IV access, only one set of blood culture sent to lab, not to delay patient care.

## 2018-09-05 NOTE — ED Notes (Signed)
ED TO INPATIENT HANDOFF REPORT  Name/Age/Gender Diana Meadows 84 y.o. female  Code Status    Code Status Orders  (From admission, onward)         Start     Ordered   09/05/18 1447  Full code  Continuous     09/05/18 1448        Code Status History    Date Active Date Inactive Code Status Order ID Comments User Context   08/03/2018 1750 08/08/2018 1932 Full Code [id number]  Duanne Guessannon, Jennifer, MD ED   03/28/2018 1529 03/29/2018 1645 Full Code [id number]  Kennedy BuckerMenz, Michael, MD Inpatient   03/28/2018 1454 03/28/2018 1529 Full Code [id number]  Kennedy BuckerMenz, Michael, MD Inpatient   01/30/2017 0058 02/02/2017 1417 Full Code [id number]  Ricarda FrameWoodham, Charles, MD Inpatient   09/14/2016 2217 09/17/2016 1749 Full Code [id number]  Oralia ManisWillis, David, MD Inpatient    Advance Directive Documentation     Most Recent Value  Type of Advance Directive  Healthcare Power of Attorney, Living will  Pre-existing out of facility DNR order (yellow form or pink MOST form)  -  "MOST" Form in Place?  -      [facility]   Chief Complaint sob fever  Level of Care/Admitting Diagnosis ED Disposition    ED Disposition Condition Comment   Admit  [facility] [[id number]]  Level of Care: Med-Surg [16]  Diagnosis: Acute on chronic respiratory failure [facility](HCC) [[id number]][[id number]]  Admitting Physician: Katha HammingKONIDENA, SNEHALATHA [[id number]]  Attending Physician: Katha HammingKONIDENA, SNEHALATHA [phone][[id number]]  Estimated length of stay: past midnight tomorrow  Certification:: I certify this patient will need inpatient services for at least 2 midnights  PT Class (Do Not Modify): Inpatient [101]  PT Acc Code (Do Not Modify): Private [1]       Medical History Past Medical History:  Diagnosis Date  . Acute pancreatitis 2013  . Arthritis   . Asthma   . Bowel obstruction (HCC)   . Cataract cortical, senile   . Cholelithiasis without obstruction   . Chronic constipation   . Colon polyp   . COPD (chronic obstructive  pulmonary disease) (HCC)   . Depression   . GERD (gastroesophageal reflux disease)   . Hernia, abdominal   . Hypertension   . Osteoporosis     Allergies Allergies  Allergen Reactions  . Codeine Other (See Comments)    Upset stomach and constipation  . Novocain [Procaine] Other (See Comments)    ineffective  . Nsaids Other (See Comments)    Reaction: Upset stomach  . Tolmetin Other (See Comments)    Reaction: Upset stomach  . Tramadol-Acetaminophen Other (See Comments)    IV Location/Drains/Wounds Patient Lines/Drains/Airways Status   Active Line/Drains/Airways    Name:   Placement date:   Placement time:   Site:   Days:   Peripheral IV 08/05/18 Anterior;Left Forearm   08/05/18    1821    Forearm   31   Peripheral IV 09/05/18 Left Antecubital   09/05/18    1329    Antecubital   less than 1   Incision (Closed) 03/28/18 Back Other (Comment)   03/28/18    1234     161          Labs/Imaging Results for orders placed or performed during the hospital encounter of 09/05/18 (from the past 48 hour(s))  Basic metabolic panel     Status: Abnormal   Collection Time: 09/05/18  1:21 PM  Result Value Ref Range  Sodium 138 135 - 145 mmol/L   Potassium 4.6 3.5 - 5.1 mmol/L   Chloride 95 (L) 98 - 111 mmol/L   CO2 35 (H) 22 - 32 mmol/L   Glucose, Bld 128 (H) 70 - 99 mg/dL   BUN 23 8 - 23 mg/dL   Creatinine, Ser 5.621.02 (H) 0.44 - 1.00 mg/dL   Calcium 9.0 8.9 - 13.010.3 mg/dL   GFR calc non Af Amer 46 (L) >60 mL/min   GFR calc Af Amer 53 (L) >60 mL/min   Anion gap 8 5 - 15    Comment: Performed at [facility], [address] [place], [place] [address]  CBC     Status: Abnormal   Collection Time: 09/05/18  1:21 PM  Result Value Ref Range   WBC 20.4 (H) 4.0 - 10.5 K/uL   RBC 4.38 3.87 - 5.11 MIL/uL   Hemoglobin 13.4 12.0 - 15.0 g/dL   HCT 46.943.7 62.936.0 - 52.846.0 %   MCV 99.8 80.0 - 100.0 fL   MCH 30.6 26.0 - 34.0 pg   MCHC 30.7 30.0 - 36.0 g/dL   RDW 41.312.3 24.411.5 - 01.015.5 %    Platelets 279 150 - 400 K/uL   nRBC 0.0 0.0 - 0.2 %    Comment: Performed at [facility], [address] [place], [place] [address]  Protime-INR     Status: None   Collection Time: 09/05/18  1:22 PM  Result Value Ref Range   Prothrombin Time 12.9 11.4 - 15.2 seconds   INR 0.98     Comment: Performed at [facility], [address] [place], [place] [address]  Lactic acid, plasma     Status: None   Collection Time: 09/05/18  1:22 PM  Result Value Ref Range   Lactic Acid, Venous 1.3 0.5 - 1.9 mmol/L    Comment: Performed at [facility], [address] [place], [place] [address]  Influenza panel by PCR (type A & B)     Status: None   Collection Time: 09/05/18  2:39 PM  Result Value Ref Range   Influenza A By PCR NEGATIVE NEGATIVE   Influenza B By PCR NEGATIVE NEGATIVE    Comment: (NOTE) The Xpert Xpress Flu assay is intended as an aid in the diagnosis of  influenza and should not be used as a sole basis for treatment.  This  assay is FDA approved for nasopharyngeal swab specimens only. Nasal  washings and aspirates are unacceptable for Xpert Xpress Flu testing. Performed at [facility], [address] [place], [place] [address]    Dg Chest 2 View  Result Date: 09/05/2018 CLINICAL DATA:  Cough, short of breath, weakness EXAM: CHEST - 2 VIEW COMPARISON:  Chest x-ray of 08/06/2017 FINDINGS: There are somewhat prominent markings at the left lung base which may represent atelectasis, but pneumonia can not be excluded. In addition, there is mild cardiomegaly present and very mild pulmonary vascular congestion and not be excluded. No acute bony abnormality is seen. The bones are diffusely osteopenic. Vertebral augmentation has been performed in an upper lumbar vertebra. IMPRESSION: 1. Possible patchy pneumonia at the left lung base which may be located posteriorly. Recommend follow-up chest x-ray. 2. Cardiomegaly. Can not exclude very mild pulmonary  vascular congestion. Electronically Signed   By: Dwyane DeePaul  Barry M.D.   On: 09/05/2018 13:48    Pending Labs Unresulted Labs (From admission, onward)    Start     Ordered   09/12/18 0500  Creatinine, serum  (enoxaparin (LOVENOX)    CrCl >/= 30 ml/min)  Weekly,   STAT    Comments:  while on enoxaparin therapy    09/05/18 1448   09/06/18 0500  Basic metabolic panel  Tomorrow morning,   STAT     09/05/18 1448   09/06/18 0500  CBC  Tomorrow morning,   STAT     09/05/18 1448   09/05/18 1357  Urinalysis, Routine w reflex microscopic  ONCE - STAT,   STAT     09/05/18 1357   09/05/18 1320  Culture, blood (Routine x 2)  BLOOD CULTURE X 2,   STAT     09/05/18 1320   09/05/18 1307  Urinalysis, Complete w Microscopic  ONCE - STAT,   STAT     09/05/18 1306          Vitals/Pain Today's Vitals   09/05/18 1430 09/05/18 1543 09/05/18 1600 09/05/18 1630  BP: 132/64  122/80 (!) 120/56  Pulse:   90 92  Resp: (!) 27  (!) 22 (!) 27  Temp:  99.3 F (37.4 C)    TempSrc:  Oral    SpO2:   92% 96%  Weight:      Height:      PainSc:        Isolation Precautions Droplet precaution  Medications Medications  gabapentin (NEURONTIN) tablet 300 mg (has no administration in time range)  0.9 %  sodium chloride infusion ( Intravenous New Bag/Given 09/05/18 1642)  acetaminophen (TYLENOL) tablet 650 mg (has no administration in time range)    Or  acetaminophen (TYLENOL) suppository 650 mg (has no administration in time range)  HYDROcodone-acetaminophen (NORCO/VICODIN) 5-325 MG per tablet 1-2 tablet (has no administration in time range)  traZODone (DESYREL) tablet 25 mg (has no administration in time range)  docusate sodium (COLACE) capsule 100 mg (has no administration in time range)  bisacodyl (DULCOLAX) EC tablet 5 mg (has no administration in time range)  ondansetron (ZOFRAN) tablet 4 mg (has no administration in time range)    Or  ondansetron (ZOFRAN) injection 4 mg (has no administration in time  range)  enoxaparin (LOVENOX) injection 40 mg (has no administration in time range)  methylPREDNISolone sodium succinate (SOLU-MEDROL) 40 mg/mL injection 40 mg (has no administration in time range)  ipratropium-albuterol (DUONEB) 0.5-2.5 (3) MG/3ML nebulizer solution 3 mL (has no administration in time range)  cefTRIAXone (ROCEPHIN) 1 g in sodium chloride 0.9 % 100 mL IVPB (has no administration in time range)  azithromycin (ZITHROMAX) tablet 500 mg (has no administration in time range)    Followed by  azithromycin (ZITHROMAX) tablet 250 mg (has no administration in time range)  ceFEPIme (MAXIPIME) 1 g in sodium chloride 0.9 % 100 mL IVPB (0 g Intravenous Stopped 09/05/18 1509)  vancomycin (VANCOCIN) IVPB 1000 mg/200 mL premix (1,000 mg Intravenous New Bag/Given 09/05/18 1554)  sodium chloride 0.9 % bolus 1,500 mL (0 mLs Intravenous Stopped 09/05/18 1508)  acetaminophen (TYLENOL) tablet 1,000 mg (1,000 mg Oral Given 09/05/18 1430)  ipratropium-albuterol (DUONEB) 0.5-2.5 (3) MG/3ML nebulizer solution 3 mL (3 mLs Nebulization Given 09/05/18 1422)  ipratropium-albuterol (DUONEB) 0.5-2.5 (3) MG/3ML nebulizer solution 3 mL (3 mLs Nebulization Given 09/05/18 1422)  ipratropium-albuterol (DUONEB) 0.5-2.5 (3) MG/3ML nebulizer solution 3 mL (3 mLs Nebulization Given 09/05/18 1422)  methylPREDNISolone sodium succinate (SOLU-MEDROL) 125 mg/2 mL injection 125 mg (125 mg Intravenous Given 09/05/18 1430)    Mobility  ambulates

## 2018-09-05 NOTE — ED Provider Notes (Signed)
[facility] Emergency Department Provider Note  ____________________________________________  Time seen: Approximately 2:09 PM  I have reviewed the triage vital signs and the nursing notes.   HISTORY  Chief Complaint Fever; Weakness; and Shortness of Breath   HPI Diana Meadows is a 84 y.o. female with a history of COPD who presents for evaluation of cough and fever.  Patient has had a cough productive of yellow sputum for the last 4 days, progressively worsening shortness of breath and wheezing.  Has had a low-grade fever at home as well.  No known sick contact exposures.  No vomiting or diarrhea.  She denies chest pain or abdominal pain.  Shortness of breath is mostly present with exertion however patient has been using her oxygen 24 hours a day.  She usually uses 2 L at bedtime only.  Also has had generalized weakness and malaise.  Past Medical History:  Diagnosis Date  . Acute pancreatitis 2013  . Arthritis   . Asthma   . Bowel obstruction (HCC)   . Cataract cortical, senile   . Cholelithiasis without obstruction   . Chronic constipation   . Colon polyp   . COPD (chronic obstructive pulmonary disease) (HCC)   . Depression   . GERD (gastroesophageal reflux disease)   . Hernia, abdominal   . Hypertension   . Osteoporosis     Patient Active Problem List   Diagnosis Date Noted  . Partial small bowel obstruction (HCC) 08/03/2018  . Status post kyphoplasty 03/28/2018  . Small bowel obstruction due to adhesions (HCC) 01/29/2017  . COPD (chronic obstructive pulmonary disease) (HCC) 09/14/2016  . GERD (gastroesophageal reflux disease) 09/14/2016  . Depression 09/14/2016  . CAP (community acquired pneumonia) 09/14/2016    Past Surgical History:  Procedure Laterality Date  . ABDOMINAL HYSTERECTOMY     BSO  . CATARACT EXTRACTION, BILATERAL Bilateral   . CESAREAN SECTION     x 3  . CHOLECYSTECTOMY    . COLONOSCOPY    . CYST EXCISION Right    axillary  . ESOPHAGOGASTRODUODENOSCOPY  05/01/2012  . EYE SURGERY    . HERNIA REPAIR     x3 inguinal  . HIATAL HERNIA REPAIR    . KYPHOPLASTY N/A 03/28/2018   Procedure: [id number];  Surgeon: Kennedy Bucker, MD;  Location: [facility] ORS;  Service: Orthopedics;  Laterality: N/A;  . POLYPECTOMY    . SEPTOPLASTY    . STAPEDECTOMY Right   . TONSILLECTOMY      Prior to Admission medications   Medication Sig Start Date End Date Taking? Authorizing Provider  acetaminophen (TYLENOL) 500 MG tablet Take 1,000 mg by mouth 2 (two) times daily.   Yes [provider]  Cholecalciferol 1000 units TBDP Take 1,000 Units by mouth daily.   Yes [provider]  cyclobenzaprine (FLEXERIL) 5 MG tablet Take 5 mg by mouth 3 (three) times daily as needed for muscle spasms.    Yes [provider]  estradiol (ESTRACE) 0.5 MG tablet Take 1 tablet by mouth daily. 11/12/16  Yes [provider]  fluticasone (FLONASE) 50 MCG/ACT nasal spray Place 1 spray into both nostrils 2 (two) times daily.    Yes [provider]  gentamicin cream (GARAMYCIN) 0.1 % Apply 1 application topically as directed. Apply in the nose as directed   Yes [provider]  Magnesium 250 MG TABS Take 1 tablet by mouth daily.   Yes [provider]  Melatonin 3 MG TABS Take 3 mg by mouth  at bedtime.   Yes [provider]  albuterol (PROVENTIL HFA;VENTOLIN HFA) 108 (90 Base) MCG/ACT inhaler Inhale 2 puffs into the lungs every 6 (six) hours as needed for wheezing or shortness of breath.     [provider]  furosemide (LASIX) 20 MG tablet Take 20 mg by mouth daily. 02/24/18   [provider]  gabapentin (NEURONTIN) 600 MG tablet Take 0.5 tablets (300 mg total) by mouth 3 (three) times daily. 08/08/18   Piscoya, Elita QuickJose, MD  lidocaine (LIDODERM) 5 % Place 1 patch onto the skin daily. Remove & Discard patch within 12 hours or as directed by MD 08/08/18   Henrene DodgePiscoya, Jose, MD    OXYGEN Inhale 2 L into the lungs at bedtime.    [provider]  polyethylene glycol (MIRALAX / GLYCOLAX) packet Take 17 g by mouth daily as needed for mild constipation.    [provider]    Allergies Codeine; Novocain [procaine]; Nsaids; Tolmetin; and Tramadol-acetaminophen  Family History  Problem Relation Age of Onset  . Lymphoma Mother   . Tuberculosis Father     Social History Social History   Tobacco Use  . Smoking status: Former Smoker    Types: Cigarettes  . Smokeless tobacco: Never Used  Substance Use Topics  . Alcohol use: Yes    Comment: occassional  . Drug use: Never    Review of Systems  Constitutional: + fever and malaise Eyes: Negative for visual changes. ENT: Negative for sore throat. Neck: No neck pain  Cardiovascular: Negative for chest pain. Respiratory: + shortness of breath, cough Gastrointestinal: Negative for abdominal pain, vomiting or diarrhea. Genitourinary: Negative for dysuria. Musculoskeletal: Negative for back pain. Skin: Negative for rash. Neurological: Negative for headaches, weakness or numbness. Psych: No SI or HI  ____________________________________________   PHYSICAL EXAM:  VITAL SIGNS: ED Triage Vitals  Enc Vitals Group     BP 09/05/18 1302 (!) 119/47     Pulse Rate 09/05/18 1302 95     Resp 09/05/18 1302 18     Temp 09/05/18 1302 100 F (37.8 C)     Temp Source 09/05/18 1302 Oral     SpO2 09/05/18 1302 91 %     Weight 09/05/18 1306 160 lb (72.6 kg)     Height 09/05/18 1306 5\' 2"  (1.575 m)     Head Circumference --      Peak Flow --      Pain Score 09/05/18 1303 0     Pain Loc --      Pain Edu? --      Excl. in GC? --     Constitutional: Alert and oriented, actively coughing  HEENT:      Head: Normocephalic and atraumatic.         Eyes: Conjunctivae are normal. Sclera is non-icteric.       Mouth/Throat: Mucous membranes are moist.       Neck: Supple with no signs of  meningismus. Cardiovascular: Regular rate and rhythm. No murmurs, gallops, or rubs. 2+ symmetrical distal pulses are present in all extremities. No JVD. Respiratory: Increased work of breathing, hypoxic on room air, crackles and wheezes on the left, clear on the right.  Patient actively bringing up thick yellow sputum  gastrointestinal: Soft, non tender, and non distended with positive bowel sounds. No rebound or guarding. Musculoskeletal: Nontender with normal range of motion in all extremities. No edema, cyanosis, or erythema of extremities. Neurologic: Normal speech and language. Face is symmetric. Moving all extremities.  No gross focal neurologic deficits are appreciated. Skin: Skin is warm, dry and intact. No rash noted. Psychiatric: Mood and affect are normal. Speech and behavior are normal.  ____________________________________________   LABS (all labs ordered are listed, but only abnormal results are displayed)  Labs Reviewed  BASIC METABOLIC PANEL - Abnormal; Notable for the following components:      Result Value   Chloride 95 (*)    CO2 35 (*)    Glucose, Bld 128 (*)    Creatinine, Ser 1.02 (*)    GFR calc non Af Amer 46 (*)    GFR calc Af Amer 53 (*)    All other components within normal limits  CBC - Abnormal; Notable for the following components:   WBC 20.4 (*)    All other components within normal limits  CULTURE, BLOOD (ROUTINE X 2)  CULTURE, BLOOD (ROUTINE X 2)  PROTIME-INR  LACTIC ACID, PLASMA  URINALYSIS, COMPLETE (UACMP) WITH MICROSCOPIC  URINALYSIS, ROUTINE W REFLEX MICROSCOPIC  INFLUENZA PANEL BY PCR (TYPE A & B)  I-STAT CG4 LACTIC ACID, ED  I-STAT CG4 LACTIC ACID, ED   ____________________________________________  EKG  ED ECG REPORT I, Nita Sickle, the attending physician, personally viewed and interpreted this ECG.  Normal sinus rhythm with first-degree AV block, rate of 93, normal QTC, normal axis, no ST elevations or depressions.  Normal  EKG.  Improved when compared to prior. ____________________________________________  RADIOLOGY  I have personally reviewed the images performed during this visit and I agree with the Radiologist's read.   Interpretation by Radiologist:  Dg Chest 2 View  Result Date: 09/05/2018 CLINICAL DATA:  Cough, short of breath, weakness EXAM: CHEST - 2 VIEW COMPARISON:  Chest x-ray of 08/06/2017 FINDINGS: There are somewhat prominent markings at the left lung base which may represent atelectasis, but pneumonia can not be excluded. In addition, there is mild cardiomegaly present and very mild pulmonary vascular congestion and not be excluded. No acute bony abnormality is seen. The bones are diffusely osteopenic. Vertebral augmentation has been performed in an upper lumbar vertebra. IMPRESSION: 1. Possible patchy pneumonia at the left lung base which may be located posteriorly. Recommend follow-up chest x-ray. 2. Cardiomegaly. Can not exclude very mild pulmonary vascular congestion. Electronically Signed   By: Dwyane Dee M.D.   On: 09/05/2018 13:48     ____________________________________________   PROCEDURES  Procedure(s) performed: None Procedures Critical Care performed: yes  CRITICAL CARE Performed by: Nita Sickle  ?  Total critical care time: 35 min  Critical care time was exclusive of separately billable procedures and treating other patients.  Critical care was necessary to treat or prevent imminent or life-threatening deterioration.  Critical care was time spent personally by me on the following activities: development of treatment plan with patient and/or surrogate as well as nursing, discussions with consultants, evaluation of patient's response to treatment, examination of patient, obtaining history from patient or surrogate, ordering and performing treatments and interventions, ordering and review of laboratory studies, ordering and review of radiographic studies, pulse  oximetry and re-evaluation of patient's condition.  ____________________________________________   INITIAL IMPRESSION / ASSESSMENT AND PLAN / ED COURSE   84 y.o. female with a history of COPD who presents for evaluation of cough and fever.  Patient with acute on chronic hypoxic respiratory failure in the setting of pneumonia.  Currently requiring 2 L nasal cannula.  Chest x-ray concerning for a left lower lobe pneumonia.  Patient with crackles and wheezing on the left lower  lobe on auscultation.  Has a low-grade temp of 100, white count of 20, and tachypnea, consistent with sepsis. Due to recent admission for SBO treated medically, patient will be covered for HCAp with cefepime and vancomycin. Will give IVF and tylenol, duoneb x 3, and solumedrol. Will admit to Hospitalist service. Flu pending.       As part of my medical decision making, I reviewed the following data within the electronic MEDICAL RECORD NUMBER History obtained from family, Nursing notes reviewed and incorporated, Labs reviewed , EKG interpreted , Old EKG reviewed, Old chart reviewed, Radiograph reviewed , Discussed with admitting physician , Notes from prior ED visits and [place] Controlled Substance Database    Pertinent labs & imaging results that were available during my care of the patient were reviewed by me and considered in my medical decision making (see chart for details).    ____________________________________________   FINAL CLINICAL IMPRESSION(S) / ED DIAGNOSES  Final diagnoses:  Sepsis, due to unspecified organism, unspecified whether acute organ dysfunction present (HCC)  Healthcare-associated pneumonia  Acute on chronic respiratory failure with hypoxia (HCC)      NEW MEDICATIONS STARTED DURING THIS VISIT:  ED Discharge Orders    None       Note:  This document was prepared using Dragon voice recognition software and may include unintentional dictation errors.    Don PerkingVeronese, [place], MD 09/05/18  [phone]

## 2018-09-05 NOTE — ED Triage Notes (Addendum)
Pt arrived via POV from home with reports of several days of weakness, cough, shortness of breath and subjective fevers.  Pt has been taking Tylenol at home. PT has deep cough that has been productive at times.  [facility].  Pt has COPD and usually wears oxygen at night, however, for the past week pt has been using all day.

## 2018-09-06 LAB — CBC
HCT: 40.1 % (ref 36.0–46.0)
Hemoglobin: 12.7 g/dL (ref 12.0–15.0)
MCH: 30.3 pg (ref 26.0–34.0)
MCHC: 31.7 g/dL (ref 30.0–36.0)
MCV: 95.7 fL (ref 80.0–100.0)
Platelets: 178 10*3/uL (ref 150–400)
RBC: 4.19 MIL/uL (ref 3.87–5.11)
RDW: 12.1 % (ref 11.5–15.5)
WBC: 19.5 10*3/uL — ABNORMAL HIGH (ref 4.0–10.5)
nRBC: 0 % (ref 0.0–0.2)

## 2018-09-06 LAB — BASIC METABOLIC PANEL
Anion gap: 6 (ref 5–15)
BUN: 25 mg/dL — ABNORMAL HIGH (ref 8–23)
CO2: 29 mmol/L (ref 22–32)
Calcium: 8 mg/dL — ABNORMAL LOW (ref 8.9–10.3)
Chloride: 104 mmol/L (ref 98–111)
Creatinine, Ser: 0.73 mg/dL (ref 0.44–1.00)
GFR calc Af Amer: >60 mL/min (ref >60)
GFR calc non Af Amer: >60 mL/min (ref >60)
Glucose, Bld: 155 mg/dL — ABNORMAL HIGH (ref 70–99)
POTASSIUM: 6 mmol/L — AB (ref 3.5–5.1)
Sodium: 139 mmol/L (ref 135–145)

## 2018-09-06 LAB — GLUCOSE, CAPILLARY: Glucose-Capillary: 136 mg/dL — ABNORMAL HIGH (ref 70–99)

## 2018-09-06 MED ORDER — PATIROMER SORBITEX CALCIUM 8.4 G PO PACK
8.4000 g | PACK | Freq: Once | ORAL | Status: AC
  Administered 2018-09-06: 8.4 g via ORAL
  Filled 2018-09-06: qty 1

## 2018-09-06 MED ORDER — ENOXAPARIN SODIUM 40 MG/0.4ML ~~LOC~~ SOLN
40.0000 mg | SUBCUTANEOUS | Status: DC
  Administered 2018-09-06 – 2018-09-07 (×2): 40 mg via SUBCUTANEOUS
  Filled 2018-09-06 (×2): qty 0.4

## 2018-09-06 MED ORDER — MAGNESIUM OXIDE 400 (241.3 MG) MG PO TABS
200.0000 mg | ORAL_TABLET | Freq: Every day | ORAL | Status: DC
  Administered 2018-09-06 – 2018-09-08 (×3): 200 mg via ORAL
  Filled 2018-09-06 (×3): qty 1

## 2018-09-06 MED ORDER — CYCLOBENZAPRINE HCL 10 MG PO TABS
5.0000 mg | ORAL_TABLET | Freq: Three times a day (TID) | ORAL | Status: DC | PRN
  Administered 2018-09-06 (×2): 5 mg via ORAL
  Filled 2018-09-06 (×2): qty 1

## 2018-09-06 NOTE — Progress Notes (Signed)
Anticoagulation monitoring(Lovenox):   84 yo female ordered Lovenox 30 mg Q24h  Filed Weights   09/05/18 1306  Weight: 160 lb (72.6 kg)   BMI    Lab Results  Component Value Date   CREATININE 0.73 09/06/2018   CREATININE 1.02 (H) 09/05/2018   CREATININE 0.66 08/07/2018   Estimated Creatinine Clearance: 37.5 mL/min (by C-G formula based on SCr of 0.73 mg/dL). Hemoglobin & Hematocrit     Component Value Date/Time   HGB 12.7 09/06/2018 0633   HGB 13.0 05/03/2014 0009   HCT 40.1 09/06/2018 0633   HCT 41.3 05/03/2014 0009     Per Protocol for Patient with estCrcl > 30 ml/min and BMI < 40, will transition to Lovenox 40 mg Q24h.

## 2018-09-06 NOTE — Progress Notes (Signed)
Pt is more agitated and hyper than usual per the daughter. Pt is having cramps and shakes in her feet. MD made aware. Magnesium, Veltassa, and flexeril administered. Pt refuses leg pumps at this time. Weaned to 2 L O2 and sating at 96%. Will continue to monitor.

## 2018-09-06 NOTE — Care Management Note (Signed)
Case Management Note  Patient Details  Name: Diana Meadows V Overturf MRN: [id number] Date of Birth: March 30, 1919  [facility]ive[facility]Objective[facility]  [facility] and receives PT through there. Patient is on chronic O2 through [facility]. Patient has a walker but does not always need it. Requires no other DME. Per family there are no needs. Based on last admissions documentation there is not a need for home health orders as the patient [facility] to do her [facility] sessions. Daughter is able to provide transportation.                   Action/Plan:   Expected Discharge Date:  09/07/18               Expected Discharge Plan:     In-House Referral:     Discharge planning Services     Post Acute Care Choice:    Choice offered to:     DME Arranged:    DME Agency:     HH Arranged:    HH Agency:     Status of Service:     If discussed at [employer] Meetings, dates discussed:    Additional Comments:  Virgel ManifoldJosh A Coree Brame, RN 09/06/2018, 8:59 AM

## 2018-09-06 NOTE — Progress Notes (Signed)
[facility]   PATIENT NAME: Diana Meadows    MR#:  [id number]  DATE OF BIRTH:  1918/11/12  SUBJECTIVE:   States she is feeling a little bit better today.  Her shortness of breath has improved.  She endorses cough productive of green sputum.  No chest pain.  REVIEW OF SYSTEMS:  Review of Systems  Constitutional: Negative for chills and fever.  HENT: Negative for congestion and sore throat.   Eyes: Negative for blurred vision and double vision.  Respiratory: Positive for cough, sputum production and shortness of breath.   Cardiovascular: Negative for chest pain, palpitations and leg swelling.  Gastrointestinal: Negative for nausea and vomiting.  Genitourinary: Negative for dysuria and urgency.  Musculoskeletal: Negative for back pain and neck pain.  Neurological: Negative for dizziness and headaches.  Psychiatric/Behavioral: Negative for depression. The patient is not nervous/anxious.     DRUG ALLERGIES:   Allergies  Allergen Reactions  . Codeine Other (See Comments)    Upset stomach and constipation  . Novocain [Procaine] Other (See Comments)    ineffective  . Nsaids Other (See Comments)    Reaction: Upset stomach  . Tolmetin Other (See Comments)    Reaction: Upset stomach  . Tramadol-Acetaminophen Other (See Comments)   VITALS:  Blood pressure 128/60, pulse (!) 125, temperature 97.6 F (36.4 C), temperature source Axillary, resp. rate 13, height 5\' 2"  (1.575 m), weight 72.6 kg, SpO2 96 %. PHYSICAL EXAMINATION:  Physical Exam  GENERAL:  84 y.o.-year-old patient lying in the bed with no acute distress.  EYES: Pupils equal, round, reactive to light . No scleral icterus. Extraocular muscles intact.  HEENT: Head atraumatic, normocephalic. Oropharynx and nasopharynx clear.  NECK:  Supple, no jugular venous distention. No thyroid enlargement, no tenderness.  LUNGS: Diffuse expiratory wheezing in all lung fields.  Normal work of  breathing.  Nasal cannula in place. CARDIOVASCULAR: RRR, S1, S2.No murmurs, rubs, or gallops.  ABDOMEN: Soft, nontender, nondistended. Bowel sounds present. No organomegaly or mass.  EXTREMITIES: No pedal edema, cyanosis, or clubbing.  NEUROLOGIC: Cranial nerves II through XII are intact. Muscle strength 5/5 in all extremities. Sensation intact. Gait not checked.  PSYCHIATRIC: The patient is alert and oriented x 3.  SKIN: No obvious rash, lesion, or ulcer.  LABORATORY PANEL:  Female CBC Recent Labs  Lab 09/06/18 0633  WBC 19.5*  HGB 12.7  HCT 40.1  PLT 178   ------------------------------------------------------------------------------------------------------------------ Chemistries  Recent Labs  Lab 09/06/18 0633  NA 139  K 6.0*  CL 104  CO2 29  GLUCOSE 155*  BUN 25*  CREATININE 0.73  CALCIUM 8.0*   RADIOLOGY:  No results found. ASSESSMENT AND PLAN:   Acute on chronic respiratory failure- secondary to COPD exacerbation and CAP. Initially showing signs of sepsis in the ED, but this is resolving. Leukocytosis improving. Uses 2L O2 at night chronically.  -Continue oxygen, wean as tolerated -Continue IV steroids and IV antibiotics -Duonebs prn   Hyperkalemia- may be related to AKI vs hemolyzed sample. -Will give a dose of Veltassa -Recheck in the morning  Acute kidney injury- resolved with IVFs -Stop IVFs today  Recent admission for small bowel obstruction- no abdominal pain this admission. -Continue stool softeners  All the records are reviewed and case discussed with Care Management/Social Worker. Management plans discussed with the patient, family and they are in agreement.  CODE STATUS: Full Code  TOTAL TIME TAKING CARE OF THIS PATIENT: 35 minutes.   More than  50% of the time was spent in counseling/coordination of care: YES  POSSIBLE D/C IN 1-2 DAYS, DEPENDING ON CLINICAL CONDITION.   Jinny BlossomKaty D Mayo M.D on 09/06/2018 at 6:23 PM  Between 7am to 6pm - Pager  - [phone]  After 6pm go to [email or web address] - Scientist, research (life sciences)password EPAS [facility]  [facility]  Office  [phone]  CC: Primary care physician; Lauro RegulusAnderson, Marshall W, MD  Note: This dictation was prepared with Dragon dictation along with smaller phrase technology. Any transcriptional errors that result from this process are unintentional.

## 2018-09-07 LAB — BASIC METABOLIC PANEL
Anion gap: 7 (ref 5–15)
BUN: 22 mg/dL (ref 8–23)
CALCIUM: 8.4 mg/dL — AB (ref 8.9–10.3)
CO2: 28 mmol/L (ref 22–32)
Chloride: 106 mmol/L (ref 98–111)
Creatinine, Ser: 0.61 mg/dL (ref 0.44–1.00)
GFR calc non Af Amer: >60 mL/min (ref >60)
Glucose, Bld: 149 mg/dL — ABNORMAL HIGH (ref 70–99)
Potassium: 4.3 mmol/L (ref 3.5–5.1)
Sodium: 141 mmol/L (ref 135–145)

## 2018-09-07 LAB — CBC
HCT: 38.6 % (ref 36.0–46.0)
Hemoglobin: 12 g/dL (ref 12.0–15.0)
MCH: 30.8 pg (ref 26.0–34.0)
MCHC: 31.1 g/dL (ref 30.0–36.0)
MCV: 99 fL (ref 80.0–100.0)
NRBC: 0 % (ref 0.0–0.2)
Platelets: 297 10*3/uL (ref 150–400)
RBC: 3.9 MIL/uL (ref 3.87–5.11)
RDW: 11.9 % (ref 11.5–15.5)
WBC: 19.8 10*3/uL — ABNORMAL HIGH (ref 4.0–10.5)

## 2018-09-07 LAB — GLUCOSE, CAPILLARY: Glucose-Capillary: 138 mg/dL — ABNORMAL HIGH (ref 70–99)

## 2018-09-07 MED ORDER — METHYLPREDNISOLONE SODIUM SUCC 40 MG IJ SOLR
40.0000 mg | Freq: Two times a day (BID) | INTRAMUSCULAR | Status: DC
  Administered 2018-09-07: 40 mg via INTRAVENOUS
  Filled 2018-09-07: qty 1

## 2018-09-07 MED ORDER — IPRATROPIUM-ALBUTEROL 0.5-2.5 (3) MG/3ML IN SOLN: 3.0000 mL | Freq: Four times a day (QID) | RESPIRATORY_TRACT | Status: DC | PRN

## 2018-09-07 MED ORDER — GUAIFENESIN-DM 100-10 MG/5ML PO SYRP
5.0000 mL | ORAL_SOLUTION | ORAL | Status: DC | PRN
  Administered 2018-09-07: 5 mL via ORAL
  Filled 2018-09-07: qty 5

## 2018-09-07 MED ORDER — BUDESONIDE 0.5 MG/2ML IN SUSP
0.5000 mg | Freq: Two times a day (BID) | RESPIRATORY_TRACT | Status: DC
  Administered 2018-09-07 – 2018-09-08 (×2): 0.5 mg via RESPIRATORY_TRACT
  Filled 2018-09-07 (×2): qty 2

## 2018-09-07 MED ORDER — IPRATROPIUM-ALBUTEROL 0.5-2.5 (3) MG/3ML IN SOLN
3.0000 mL | Freq: Three times a day (TID) | RESPIRATORY_TRACT | Status: DC
  Administered 2018-09-07 – 2018-09-08 (×4): 3 mL via RESPIRATORY_TRACT
  Filled 2018-09-07 (×4): qty 3

## 2018-09-07 NOTE — [place] FL2 (Signed)
[place] MEDICAID FL2 LEVEL OF CARE SCREENING TOOL     IDENTIFICATION  Patient Name: Diana Meadows Birthdate: Jul 03, 1919 Sex: female Admission Date (Current Location): 09/05/2018  [place]   Facility and Address:  [facility], [address], [place], [place] [address]      Provider Number: [id number]  Attending Physician Name and Address:  Houston SirenSainani, Vivek J, MD  Relative Name and Phone Number:  Filiberto PinksJackson,Sandy Daughter [phone]  [phone]     Current Level of Care: Hospital Recommended Level of Care: Skilled Nursing Facility Prior Approval Number:    Date Approved/Denied:   PASRR Number: [id number]  Discharge Plan: SNF    Current Diagnoses: Patient Active Problem List   Diagnosis Date Noted  . Acute on chronic respiratory failure (HCC) 09/05/2018  . Partial small bowel obstruction (HCC) 08/03/2018  . Status post kyphoplasty 03/28/2018  . Small bowel obstruction due to adhesions (HCC) 01/29/2017  . COPD (chronic obstructive pulmonary disease) (HCC) 09/14/2016  . GERD (gastroesophageal reflux disease) 09/14/2016  . Depression 09/14/2016  . CAP (community acquired pneumonia) 09/14/2016    Orientation RESPIRATION BLADDER Height & Weight     Self, Time, Situation, Place  Normal Continent Weight: 160 lb (72.6 kg) Height:  5\' 2"  (157.5 cm)  BEHAVIORAL SYMPTOMS/MOOD NEUROLOGICAL BOWEL NUTRITION STATUS      Continent Diet(2 gram sodium)  AMBULATORY STATUS COMMUNICATION OF NEEDS Skin   Extensive Assist Verbally Normal                       Personal Care Assistance Level of Assistance  Bathing, Feeding, Dressing Bathing Assistance: Limited assistance Feeding assistance: Independent Dressing Assistance: Limited assistance     Functional Limitations Info  Sight, Hearing, Speech Sight Info: Adequate Hearing Info: Impaired Speech Info: Adequate    SPECIAL CARE FACTORS FREQUENCY  PT (By licensed  PT)     PT Frequency: Up to 5X per week              Contractures Contractures Info: Not present    Additional Factors Info  Code Status, Allergies Code Status Info: Full Allergies Info:  Codeine, Novocain Procaine, Nsaids, Tolmetin, Tramadol-acetaminophen           Current Medications (09/07/2018):  This is the current hospital active medication list Current Facility-Administered Medications  Medication Dose Route Frequency Provider Last Rate Last Dose  . acetaminophen (TYLENOL) tablet 650 mg  650 mg Oral Q6H PRN Katha HammingKonidena, Snehalatha, MD       Or  . acetaminophen (TYLENOL) suppository 650 mg  650 mg Rectal Q6H PRN Katha HammingKonidena, Snehalatha, MD      . azithromycin (ZITHROMAX) tablet 250 mg  250 mg Oral Daily Katha HammingKonidena, Snehalatha, MD   250 mg at 09/07/18 1009  . bisacodyl (DULCOLAX) EC tablet 5 mg  5 mg Oral Daily PRN Katha HammingKonidena, Snehalatha, MD      . budesonide (PULMICORT) nebulizer solution 0.5 mg  0.5 mg Nebulization BID Houston SirenSainani, Vivek J, MD      . cefTRIAXone (ROCEPHIN) 1 g in sodium chloride 0.9 % 100 mL IVPB  1 g Intravenous Q24H Katha HammingKonidena, Snehalatha, MD 200 mL/hr at 09/06/18 2338 1 g at 09/06/18 2338  . cyclobenzaprine (FLEXERIL) tablet 5 mg  5 mg Oral TID PRN Mayo, Allyn KennerKaty Dodd, MD   5 mg at 09/06/18 2357  . docusate sodium (COLACE) capsule 100 mg  100 mg Oral BID Katha HammingKonidena, Snehalatha, MD   100 mg at 09/07/18 1009  .  enoxaparin (LOVENOX) injection 40 mg  40 mg Subcutaneous Q24H Mayo, Allyn KennerKaty Dodd, MD   40 mg at 09/06/18 2200  . gabapentin (NEURONTIN) capsule 300 mg  300 mg Oral TID Katha HammingKonidena, Snehalatha, MD   300 mg at 09/07/18 1606  . guaiFENesin-dextromethorphan (ROBITUSSIN DM) 100-10 MG/5ML syrup 5 mL  5 mL Oral Q4H PRN Houston SirenSainani, Vivek J, MD   5 mL at 09/07/18 0344  . HYDROcodone-acetaminophen (NORCO/VICODIN) 5-325 MG per tablet 1-2 tablet  1-2 tablet Oral Q4H PRN Katha HammingKonidena, Snehalatha, MD      . ipratropium-albuterol (DUONEB) 0.5-2.5 (3) MG/3ML nebulizer solution 3 mL  3 mL  Nebulization TID Houston SirenSainani, Vivek J, MD   3 mL at 09/07/18 1353  . ipratropium-albuterol (DUONEB) 0.5-2.5 (3) MG/3ML nebulizer solution 3 mL  3 mL Nebulization Q6H PRN Houston SirenSainani, Vivek J, MD      . magnesium oxide (MAG-OX) tablet 200 mg  200 mg Oral Daily Mayo, Allyn KennerKaty Dodd, MD   200 mg at 09/07/18 1009  . methylPREDNISolone sodium succinate (SOLU-MEDROL) 40 mg/mL injection 40 mg  40 mg Intravenous Q12H Sainani, Rolly PancakeVivek J, MD      . ondansetron (ZOFRAN) tablet 4 mg  4 mg Oral Q6H PRN Katha HammingKonidena, Snehalatha, MD       Or  . ondansetron (ZOFRAN) injection 4 mg  4 mg Intravenous Q6H PRN Katha HammingKonidena, Snehalatha, MD      . traZODone (DESYREL) tablet 25 mg  25 mg Oral QHS PRN Katha HammingKonidena, Snehalatha, MD   25 mg at 09/06/18 2201     Discharge Medications: Please see discharge summary for a list of discharge medications.  Relevant Imaging Results:  Relevant Lab Results:   Additional Information SS#[id number]  Judi CongKaren M Rilley Poulter, LCSW

## 2018-09-07 NOTE — Progress Notes (Signed)
[facility] [facility] [place] at [facility]      PATIENT NAME: Diana Meadows    MR#:  [id number]  DATE OF BIRTH:  08-13-1919  SUBJECTIVE:   Patient still complaining of some shortness of breath and cough but overall improved since admission.  No other acute events overnight.  Seen by physical therapy and the recommend short-term rehab.  REVIEW OF SYSTEMS:    Review of Systems  Constitutional: Negative for chills and fever.  HENT: Negative for congestion and tinnitus.   Eyes: Negative for blurred vision and double vision.  Respiratory: Positive for shortness of breath. Negative for cough and wheezing.   Cardiovascular: Negative for chest pain, orthopnea and PND.  Gastrointestinal: Negative for abdominal pain, diarrhea, nausea and vomiting.  Genitourinary: Negative for dysuria and hematuria.  Neurological: Positive for weakness (generalized). Negative for dizziness, sensory change and focal weakness.  All other systems reviewed and are negative.   Nutrition: 2 gm. Sodium.  Tolerating Diet: Yes Tolerating PT: Eval noted.   DRUG ALLERGIES:   Allergies  Allergen Reactions  . Codeine Other (See Comments)    Upset stomach and constipation  . Novocain [Procaine] Other (See Comments)    ineffective  . Nsaids Other (See Comments)    Reaction: Upset stomach  . Tolmetin Other (See Comments)    Reaction: Upset stomach  . Tramadol-Acetaminophen Other (See Comments)    VITALS:  Blood pressure (!) 161/98, pulse (!) 124, temperature 97.7 F (36.5 C), temperature source Oral, resp. rate 18, height 5\' 2"  (1.575 m), weight 72.6 kg, SpO2 95 %.  PHYSICAL EXAMINATION:   Physical Exam  GENERAL:  84 y.o.-year-old patient sitting up in chair very hard of hearing but in NAD.   EYES: Pupils equal, round, reactive to light and accommodation. No scleral icterus. Extraocular muscles intact.  HEENT: Head atraumatic, normocephalic. Oropharynx and nasopharynx clear.  NECK:  Supple, no  jugular venous distention. No thyroid enlargement, no tenderness.  LUNGS: Normal breath sounds bilaterally, no wheezing, rales, rhonchi. No use of accessory muscles of respiration.  CARDIOVASCULAR: S1, S2 normal. No murmurs, rubs, or gallops.  ABDOMEN: Soft, nontender, nondistended. Bowel sounds present. No organomegaly or mass.  EXTREMITIES: No cyanosis, clubbing or edema b/l.    NEUROLOGIC: Cranial nerves II through XII are intact. No focal Motor or sensory deficits b/l.  Globally weak.  PSYCHIATRIC: The patient is alert and oriented x 3.  SKIN: No obvious rash, lesion, or ulcer.    LABORATORY PANEL:   CBC Recent Labs  Lab 09/07/18 0507  WBC 19.8*  HGB 12.0  HCT 38.6  PLT 297   ------------------------------------------------------------------------------------------------------------------  Chemistries  Recent Labs  Lab 09/07/18 0507  NA 141  K 4.3  CL 106  CO2 28  GLUCOSE 149*  BUN 22  CREATININE 0.61  CALCIUM 8.4*   ------------------------------------------------------------------------------------------------------------------  Cardiac Enzymes No results for input(s): TROPONINI in the last 168 hours. ------------------------------------------------------------------------------------------------------------------  RADIOLOGY:  No results found.   ASSESSMENT AND PLAN:   84 year old female with past medical history of osteoporosis, hypertension, COPD, GERD, depression, previous history of bowel obstruction who presented to the hospital due to shortness of breath.  #Acute on chronic respiratory failure- secondary to COPD exacerbation and CAP. Initially showing signs of sepsis in the ED, but this has resolved.  Leukocytosis improving. Uses 2L O2 at night chronically.  -Continue oxygen, wean as tolerated -Continue IV steroids but I will taper them and cont. IV antibiotics for now.  - cont. Duonebs and will add  Pulmicort nebs  # Hyperkalemia- may be related to  AKI vs hemolyzed sample. - improved w/ veltassa and will montior.   # Acutekidney injury- resolved with IVFs  # Recent admission for small bowel obstruction- no abdominal pain this admission. -Continue stool softeners  # Neuropathy - cont. Neurontin.     All the records are reviewed and case discussed with Care Management/Social Worker. Management plans discussed with the patient, family and they are in agreement.  CODE STATUS: Full code  DVT Prophylaxis: Lovenox  TOTAL TIME TAKING CARE OF THIS PATIENT: 30 minutes.   POSSIBLE D/C IN 2-3 DAYS, DEPENDING ON CLINICAL CONDITION.   [place] SirenSAINANI,VIVEK J M.D on 09/07/2018 at 2:35 PM  Between 7am to 6pm - Pager - [phone]  After 6pm go to [email or web address] - Scientist, research (life sciences)password [email or web address]  [facility] [place] [facility]  Office  [phone]  CC: Primary care physician; Lauro RegulusAnderson, Marshall W, MD

## 2018-09-07 NOTE — Clinical Social Work Note (Signed)
Clinical Social Work Assessment  Patient Details  Name: Diana Meadows MRN: [id number] Date of Birth: 11-Feb-1919  Date of referral:  09/07/18               Reason for consult:  Facility Placement                Permission sought to share information with:  Chartered certified accountant granted to share information::  Yes, Verbal Permission Granted  Name::        Agency::  [facility] [place] [facility], [employer], [employer]  Relationship::     Contact Information:     Housing/Transportation Living arrangements for the past 2 months:  [place] of Information:  Patient, Medical Team, Adult Children Patient Interpreter Needed:  None Criminal Activity/Legal Involvement Pertinent to Current Situation/Hospitalization:  No - Comment as needed Significant Relationships:  Adult Children, Warehouse manager, Social worker, Other Family Members Lives with:  Facility Resident Do you feel safe going back to the place where you live?  Yes Need for family participation in patient care:  No (Coment)  Care giving concerns:  PT recommendation for SNF. Patient admitted from ALF.   Social Worker assessment / plan:  The CSW met with the patient and her great-grandchildren at bedside to discuss discharge planning. The patient's daughter participated via telephone during the discussion. The CSW introduced self and role in care. The CSW explained the PT recommendation and the Medicare guidelines for SNF payment. The patient deferred to her daughter to make any discharge planning decisions. The patient's daughter gave verbal permission to make a referral to [facility], [facility], and [employer]. The patient's daughter shared that if those three locations do not have availability, the patient would return to [place] with [facility] resumption. The CSW provided the patient with the CMS.gov list of area SNFs.  The CSW has begun the referral process and will update the patient with bed  offers as available. The patient will most likely discharge tomorrow. The family is aware.  Employment status:  Retired Forensic scientist:  [employer] PT Recommendations:  [place] / Referral to community resources:  [place]  Patient/Family's Response to care:  The patient and her family thanked the CSW for assistance.  Patient/Family's Understanding of and Emotional Response to Diagnosis, Current Treatment, and Prognosis:  The patient and her family seem to understand the PT recommendation and are willing to pursue SNF at specific locations or [facility] at the ALF should those locations have no availability.   Emotional Assessment Appearance:  Appears stated age Attitude/Demeanor/Rapport:  Gracious, Engaged Affect (typically observed):  Appropriate, Pleasant, Stable Orientation:  Oriented to Self, Oriented to  Time, Oriented to Situation, Oriented to Place Alcohol / Substance use:  Never Used Psych involvement (Current and /or in the community):  No (Comment)  Discharge Needs  Concerns to be addressed:  Discharge Planning Concerns, Care Coordination Readmission within the last 30 days:  Yes Current discharge risk:  Chronically ill, Physical Impairment Barriers to Discharge:  Continued Medical Work up   [employer], LCSW 09/07/2018, 4:27 PM

## 2018-09-07 NOTE — Evaluation (Signed)
Physical Therapy Evaluation Patient Details Name: Diana Meadows MRN: [id number] DOB: Feb 19, 1919 Today's Date: 09/07/2018   History of Present Illness  Pt is a 84 y/o F who [facility] with cough, fever, and SOB mostly on exertion.  Pt's PMH includes kyphoplasty, COPD, cataracts, osteoporosis.  Clinical Impression  Patient is a 84 year old female presenting with impairments of decreased activity tolerance and endurance secondary to SOB, decreased safety awareness, decreased strength and decreased balance. Patient is currently unable to complete transfers safely without guarding and cuing for safety and is requiring cuing for foot clearance and AD management with ambulation and guarding for balance safety. Patient is currently unable to safely negotiate her home environment at [facility] d/t impairments and activity restrictions. Would benefit from skilled PT to address above deficits and promote optimal return to PLOF    Follow Up Recommendations SNF    Equipment Recommendations  Standard walker    Recommendations for Other Services       Precautions / Restrictions        Mobility  Bed Mobility Overal bed mobility: Modified Independent             General bed mobility comments: Ind with increased time needed  Transfers Overall transfer level: Needs assistance Equipment used: Rolling walker (2 wheeled) Transfers: Sit to/from [employer]Stand;Stand Pivot Transfers Sit to Stand: Min guard Stand pivot transfers: Supervision       General transfer comment: Requires cuing for AD management with STS, and min gaurd from PT for support especially in first 5sec after standing. Better AD carry over with pivot chair <> chair transfer. Impulsive  Ambulation/Gait Ambulation/Gait assistance: Min guard Gait Distance (Feet): 20 Feet Assistive device: Rolling walker (2 wheeled) Gait Pattern/deviations: WFL(Within Functional Limits)     General Gait Details:  Consistent cuing to reduce speed with AD for line/lead management and to keep AD "close to her"  Stairs            Wheelchair Mobility    Modified Rankin (Stroke Patients Only)       Balance Overall balance assessment: Needs assistance Sitting-balance support: No upper extremity supported Sitting balance-Leahy Scale: Good     Standing balance support: Bilateral upper extremity supported;No upper extremity supported Standing balance-Leahy Scale: Fair                               Pertinent Vitals/Pain Pain Assessment: No/denies pain    Home Living Family/patient expects to be discharged to:: Assisted living               Home Equipment: Walker - 2 wheels;Shower seat - built in;Grab bars - toilet;Grab bars - tub/shower;Bedside commode Additional Comments: Reports that she has RW, but "does not use it". Reports using bedside commode at ALF and nursing assistance with cleaning    Prior Function Level of Independence: Independent with assistive device(s)         Comments: Patient denies recent falls. Ambulates without AD but reports has assistance with ADLs. 2L O2 at night only at baseline     Hand Dominance   Dominant Hand: Right    Extremity/Trunk Assessment   Upper Extremity Assessment Upper Extremity Assessment: Overall WFL for tasks assessed    Lower Extremity Assessment Lower Extremity Assessment: Overall WFL for tasks assessed    Cervical / Trunk Assessment Cervical / Trunk Assessment: Kyphotic  Communication   Communication: HOH  Cognition  Arousal/Alertness: Awake/alert Behavior During Therapy: Agitated Overall Cognitive Status: Within Functional Limits for tasks assessed                                 General Comments: Patient with aggitation of "not understanding lines/leads and not wanting them on". Better and apologetic after PT explanation of telemetry monitoring purpose and lung/heart connection       General Comments      Exercises Other Exercises Other Exercises: Education on purpose of intervention and lung/cardiac monitoring. Education on proper technique with transferring and ambulation to increase safety and reduce fall risk. Patient is able to comply with cuing for AD management for stand pivot transfer following cuing from STS transfer. Able to comply with cuing for AD managemnt during ambulation 50%   Assessment/Plan    PT Assessment Patient needs continued PT services  PT Problem List Decreased coordination;Decreased activity tolerance;Decreased safety awareness;Decreased mobility       PT Treatment Interventions DME instruction;Therapeutic exercise;Gait training;Balance training;Neuromuscular re-education;Therapeutic activities;Patient/family education    PT Goals (Current goals can be found in the Care Plan section)  Acute Rehab PT Goals Patient Stated Goal: Go back home and walk ind PT Goal Formulation: With patient Time For Goal Achievement: 09/21/18 Potential to Achieve Goals: Fair    Frequency Min 2X/week   Barriers to discharge        Co-evaluation               AM-PAC PT "6 Clicks" Mobility  Outcome Measure Help needed turning from your back to your side while in a flat bed without using bedrails?: A Little Help needed moving from lying on your back to sitting on the side of a flat bed without using bedrails?: A Little Help needed moving to and from a bed to a chair (including a wheelchair)?: A Little Help needed standing up from a chair using your arms (e.g., wheelchair or bedside chair)?: A Little Help needed to walk in hospital room?: A Lot Help needed climbing 3-5 steps with a railing? : Total 6 Click Score: 15    End of Session Equipment Utilized During Treatment: Gait belt Activity Tolerance: Patient tolerated treatment well Patient left: with chair alarm set;in chair;with call bell/phone within reach Nurse Communication: Mobility  status PT Visit Diagnosis: Difficulty in walking, not elsewhere classified (R26.2);Unsteadiness on feet (R26.81)    Time: 4034-74250830-0909 PT Time Calculation (min) (ACUTE ONLY): 39 min   Charges:   PT Evaluation $PT Eval Moderate Complexity: 1 Mod PT Treatments $Therapeutic Activity: 8-22 mins        Staci Acostahelsea Miller PT, DPT  Staci Acostahelsea Miller 09/07/2018, 9:28 AM

## 2018-09-08 LAB — BASIC METABOLIC PANEL
Anion gap: 8 (ref 5–15)
BUN: 22 mg/dL (ref 8–23)
CHLORIDE: 103 mmol/L (ref 98–111)
CO2: 31 mmol/L (ref 22–32)
Calcium: 8.5 mg/dL — ABNORMAL LOW (ref 8.9–10.3)
Creatinine, Ser: 0.71 mg/dL (ref 0.44–1.00)
GFR calc Af Amer: >60 mL/min (ref >60)
GFR calc non Af Amer: >60 mL/min (ref >60)
Glucose, Bld: 131 mg/dL — ABNORMAL HIGH (ref 70–99)
Potassium: 4 mmol/L (ref 3.5–5.1)
Sodium: 142 mmol/L (ref 135–145)

## 2018-09-08 LAB — GLUCOSE, CAPILLARY: Glucose-Capillary: 119 mg/dL — ABNORMAL HIGH (ref 70–99)

## 2018-09-08 LAB — CBC
HCT: 41.3 % (ref 36.0–46.0)
HEMOGLOBIN: 12.9 g/dL (ref 12.0–15.0)
MCH: 30.4 pg (ref 26.0–34.0)
MCHC: 31.2 g/dL (ref 30.0–36.0)
MCV: 97.2 fL (ref 80.0–100.0)
Platelets: 333 10*3/uL (ref 150–400)
RBC: 4.25 MIL/uL (ref 3.87–5.11)
RDW: 12.4 % (ref 11.5–15.5)
WBC: 16.4 10*3/uL — ABNORMAL HIGH (ref 4.0–10.5)
nRBC: 0 % (ref 0.0–0.2)

## 2018-09-08 MED ORDER — GUAIFENESIN-DM 100-10 MG/5ML PO SYRP: 5.0000 mL | ORAL_SOLUTION | ORAL | 0 refills | Status: AC | PRN

## 2018-09-08 MED ORDER — LEVOFLOXACIN 500 MG PO TABS: 500.0000 mg | ORAL_TABLET | Freq: Every day | ORAL | 0 refills | Status: AC

## 2018-09-08 MED ORDER — PREDNISONE 10 MG PO TABS: ORAL_TABLET | ORAL | 0 refills | Status: AC

## 2018-09-08 MED ORDER — METHYLPREDNISOLONE SODIUM SUCC 40 MG IJ SOLR
40.0000 mg | Freq: Every day | INTRAMUSCULAR | Status: DC
  Administered 2018-09-08: 40 mg via INTRAVENOUS
  Filled 2018-09-08: qty 1

## 2018-09-08 NOTE — Care Management Important Message (Signed)
Important Message  Patient Details  Name: Diana Meadows MRN: [id number] Date of Birth: 1919/06/19   Medicare Important Message Given:  Yes    Olegario MessierKathy A Ileana Chalupa 09/08/2018, 10:30 AM

## 2018-09-08 NOTE — Progress Notes (Signed)
Pt discharged to [facility] with daughter.

## 2018-09-08 NOTE — Progress Notes (Signed)
Handoff report called to [place] at [facility].

## 2018-09-08 NOTE — Progress Notes (Signed)
Discharge instructions, prescriptions given to daughter. Discharge packet given to daughter to take to [facility].

## 2018-09-08 NOTE — [place] FL2 (Signed)
[place] MEDICAID FL2 LEVEL OF CARE SCREENING TOOL     IDENTIFICATION  Patient Name: Diana Meadows Birthdate: March 28, 1919 Sex: female Admission Date (Current Location): 09/05/2018  [place]   Facility and Address:  [facility], [address], [place], [place] [address]      Provider Number: [id number]  Attending Physician Name and Address:  Houston SirenSainani, Vivek J, MD  Relative Name and Phone Number:  Filiberto PinksJackson,Sandy Daughter [phone]  [phone]     Current Level of Care: Hospital Recommended Level of Care: Assisted Living Facility Prior Approval Number:    Date Approved/Denied:   PASRR Number: [id number]  Discharge Plan: Domiciliary (Rest home)    Current Diagnoses: Patient Active Problem List   Diagnosis Date Noted  . Acute on chronic respiratory failure (HCC) 09/05/2018  . Partial small bowel obstruction (HCC) 08/03/2018  . Status post kyphoplasty 03/28/2018  . Small bowel obstruction due to adhesions (HCC) 01/29/2017  . COPD (chronic obstructive pulmonary disease) (HCC) 09/14/2016  . GERD (gastroesophageal reflux disease) 09/14/2016  . Depression 09/14/2016  . CAP (community acquired pneumonia) 09/14/2016    Orientation RESPIRATION BLADDER Height & Weight     Self, Time, Situation, Place  O2(2 Liters Chronic Oxygen ) Continent Weight: 161 lb 13.1 oz (73.4 kg) Height:  5\' 2"  (157.5 cm)  BEHAVIORAL SYMPTOMS/MOOD NEUROLOGICAL BOWEL NUTRITION STATUS      Continent Diet(Diet: Low Sodium, no added salt. )  AMBULATORY STATUS COMMUNICATION OF NEEDS Skin   Limited Assist Verbally Normal                       Personal Care Assistance Level of Assistance  Bathing, Feeding, Dressing Bathing Assistance: Limited assistance Feeding assistance: Independent Dressing Assistance: Limited assistance     Functional Limitations Info  Sight, Hearing, Speech Sight Info: Impaired Hearing Info: Impaired Speech  Info: Adequate    SPECIAL CARE FACTORS FREQUENCY  PT (By licensed PT)     PT Frequency: (2-3 home health. )              Contractures Contractures Info: Not present    Additional Factors Info  Code Status, Allergies Code Status Info: (Full Code. ) Allergies Info: (Codeine, Novocain Procaine, Nsaids, Tolmetin, Tramadol- acetaminophen)          Discharge Medications: Please see discharge summary for a list of discharge medications. Medication List    TAKE these medications   acetaminophen 500 MG tablet Commonly known as:  TYLENOL Take 1,000 mg by mouth every 12 (twelve) hours as needed for mild pain or fever.   albuterol 108 (90 Base) MCG/ACT inhaler Commonly known as:  PROVENTIL HFA;VENTOLIN HFA Inhale 2 puffs into the lungs every 6 (six) hours as needed for wheezing or shortness of breath.   Cholecalciferol 25 MCG (1000 UT) Tbdp Take 1,000 Units by mouth daily.   estradiol 0.5 MG tablet Commonly known as:  ESTRACE Take 1 tablet by mouth daily.   fluticasone 50 MCG/ACT nasal spray Commonly known as:  FLONASE Place 2 sprays into both nostrils daily as needed for allergies.   Fluticasone-Salmeterol 100-50 MCG/DOSE Aepb Commonly known as:  ADVAIR Inhale 1 puff into the lungs every 12 (twelve) hours.   gabapentin 600 MG tablet Commonly known as:  NEURONTIN Take 0.5 tablets (300 mg total) by mouth 3 (three) times daily.   guaiFENesin-dextromethorphan 100-10 MG/5ML syrup Commonly known as:  ROBITUSSIN DM Take 5 mLs by mouth every 4 (four) hours  as needed for cough (chest congestion).   levofloxacin 500 MG tablet Commonly known as:  LEVAQUIN Take 1 tablet (500 mg total) by mouth daily for 5 days.   Magnesium 250 MG Tabs Take 1 tablet by mouth daily.   polyethylene glycol packet Commonly known as:  MIRALAX / GLYCOLAX Take 17 g by mouth daily as needed for mild constipation.   predniSONE 10 MG tablet Commonly known as:  DELTASONE Label  &  dispense according to the schedule below. 5 Pills PO for 1 day then, 4 Pills PO for 1 day, 3 Pills PO for 1 day, 2 Pills PO for 1 day, 1 Pill PO for 1 days then STOP.   tiZANidine 2 MG tablet Commonly known as:  ZANAFLEX Take 2 mg by mouth every 8 (eight) hours as needed for muscle spasms.  Relevant Imaging Results: Relevant Lab Results: Additional Information (SSN: [id number])  Ladd Cen, Darleen CrockerBailey M, LCSW

## 2018-09-08 NOTE — Care Management (Signed)
Referral to [facility] as requested from facility. Per Maralyn SagoSarah with [facility], they are familiar with this patient.

## 2018-09-08 NOTE — Discharge Summary (Signed)
[employer] [facility] - [place] at [facility]   PATIENT NAME: Diana Meadows    MR#:  [id number]  DATE OF BIRTH:  06-08-1919  DATE OF ADMISSION:  09/05/2018 ADMITTING PHYSICIAN: Katha HammingSnehalatha Konidena, MD  DATE OF DISCHARGE: 09/08/2018  PRIMARY CARE PHYSICIAN: Lauro RegulusAnderson, Marshall W, MD    ADMISSION DIAGNOSIS:  Healthcare-associated pneumonia [J18.9] Acute on chronic respiratory failure with hypoxia (HCC) [J96.21] Sepsis, due to unspecified organism, unspecified whether acute organ dysfunction present (HCC) [A41.9]  DISCHARGE DIAGNOSIS:  Active Problems:   Acute on chronic respiratory failure (HCC)   SECONDARY DIAGNOSIS:   Past Medical History:  Diagnosis Date  . Acute pancreatitis 2013  . Arthritis   . Asthma   . Bowel obstruction (HCC)   . Cataract cortical, senile   . Cholelithiasis without obstruction   . Chronic constipation   . Colon polyp   . COPD (chronic obstructive pulmonary disease) (HCC)   . Depression   . GERD (gastroesophageal reflux disease)   . Hernia, abdominal   . Hypertension   . Osteoporosis     HOSPITAL COURSE:   84 year old female with past medical history of osteoporosis, hypertension, COPD, GERD, depression, previous history of bowel obstruction who presented to the hospital due to shortness of breath.  #Acute on chronic respiratory failure-secondary to COPD exacerbation and CAP. Initially showing signs of sepsis in the ED, but this has resolved.   -Patient was treated with IV steroids, scheduled duo nebs, Pulmicort nebs.  Patient was also given empiric antibiotics with ceftriaxone, Zithromax.  She has clinically improved and has no further wheezing or bronchospasm and is no longer hypoxic. - Patient is being discharged on oral prednisone taper and empiric Levaquin for 5 more days.  # Hyperkalemia-secondary to some acute kidney injury.  Patient was given Veltassa potassium has now normalized.  # Acutekidney injury- resolved with  IVFs  # Recent admission for small bowel obstruction- no abdominal pain this admission. - pt. Will Continue stool softeners  # Neuropathy - pt. Will cont. Neurontin.   DISCHARGE CONDITIONS:   Stable.   CONSULTS OBTAINED:    DRUG ALLERGIES:   Allergies  Allergen Reactions  . Codeine Other (See Comments)    Upset stomach and constipation  . Novocain [Procaine] Other (See Comments)    ineffective  . Nsaids Other (See Comments)    Reaction: Upset stomach  . Tolmetin Other (See Comments)    Reaction: Upset stomach  . Tramadol-Acetaminophen Other (See Comments)    DISCHARGE MEDICATIONS:   Allergies as of 09/08/2018      Reactions   Codeine Other (See Comments)   Upset stomach and constipation   Novocain [procaine] Other (See Comments)   ineffective   Nsaids Other (See Comments)   Reaction: Upset stomach   Tolmetin Other (See Comments)   Reaction: Upset stomach   Tramadol-acetaminophen Other (See Comments)      Medication List    TAKE these medications   acetaminophen 500 MG tablet Commonly known as:  TYLENOL Take 1,000 mg by mouth every 12 (twelve) hours as needed for mild pain or fever.   albuterol 108 (90 Base) MCG/ACT inhaler Commonly known as:  PROVENTIL HFA;VENTOLIN HFA Inhale 2 puffs into the lungs every 6 (six) hours as needed for wheezing or shortness of breath.   Cholecalciferol 25 MCG (1000 UT) Tbdp Take 1,000 Units by mouth daily.   estradiol 0.5 MG tablet Commonly known as:  ESTRACE Take 1 tablet by mouth daily.   fluticasone 50 MCG/ACT nasal spray  Commonly known as:  FLONASE Place 2 sprays into both nostrils daily as needed for allergies.   Fluticasone-Salmeterol 100-50 MCG/DOSE Aepb Commonly known as:  ADVAIR Inhale 1 puff into the lungs every 12 (twelve) hours.   gabapentin 600 MG tablet Commonly known as:  NEURONTIN Take 0.5 tablets (300 mg total) by mouth 3 (three) times daily.   guaiFENesin-dextromethorphan 100-10 MG/5ML  syrup Commonly known as:  ROBITUSSIN DM Take 5 mLs by mouth every 4 (four) hours as needed for cough (chest congestion).   levofloxacin 500 MG tablet Commonly known as:  LEVAQUIN Take 1 tablet (500 mg total) by mouth daily for 5 days.   Magnesium 250 MG Tabs Take 1 tablet by mouth daily.   polyethylene glycol packet Commonly known as:  MIRALAX / GLYCOLAX Take 17 g by mouth daily as needed for mild constipation.   predniSONE 10 MG tablet Commonly known as:  DELTASONE Label  & dispense according to the schedule below. 5 Pills PO for 1 day then, 4 Pills PO for 1 day, 3 Pills PO for 1 day, 2 Pills PO for 1 day, 1 Pill PO for 1 days then STOP.   tiZANidine 2 MG tablet Commonly known as:  ZANAFLEX Take 2 mg by mouth every 8 (eight) hours as needed for muscle spasms.         DISCHARGE INSTRUCTIONS:   DIET:  Regular diet  DISCHARGE CONDITION:  Stable  ACTIVITY:  Activity as tolerated  OXYGEN:  Home Oxygen: No.   Oxygen Delivery: room air  DISCHARGE LOCATION:  Assisted Living with Home Health Services.    If you experience worsening of your admission symptoms, develop shortness of breath, life threatening emergency, suicidal or homicidal thoughts you must seek medical attention immediately by calling 911 or calling your MD immediately  if symptoms less severe.  You Must read complete instructions/literature along with all the possible adverse reactions/side effects for all the Medicines you take and that have been prescribed to you. Take any new Medicines after you have completely understood and accpet all the possible adverse reactions/side effects.   Please note  You were cared for by a hospitalist during your hospital stay. If you have any questions about your discharge medications or the care you received while you were in the hospital after you are discharged, you can call the unit and asked to speak with the hospitalist on call if the hospitalist that took care of  you is not available. Once you are discharged, your primary care physician will handle any further medical issues. Please note that NO REFILLS for any discharge medications will be authorized once you are discharged, as it is imperative that you return to your primary care physician (or establish a relationship with a primary care physician if you do not have one) for your aftercare needs so that they can reassess your need for medications and monitor your lab values.     Today   No Acute events overnight.  No worsening shortness of breath, cough, congestion. Will d/c back to assisted Living today.   VITAL SIGNS:  Blood pressure (!) 158/69, pulse 87, temperature 98.3 F (36.8 C), temperature source Oral, resp. rate 18, height 5\' 2"  (1.575 m), weight 73.4 kg, SpO2 98 %.  I/O:    Intake/Output Summary (Last 24 hours) at 09/08/2018 1153 Last data filed at 09/08/2018 0941 Gross per 24 hour  Intake 820 ml  Output 0 ml  Net 820 ml    PHYSICAL EXAMINATION:  GENERAL:  84 y.o.-year-old patient sitting up in chair in NAD.  EYES: Pupils equal, round, reactive to light and accommodation. No scleral icterus. Extraocular muscles intact.  HEENT: Head atraumatic, normocephalic. Oropharynx and nasopharynx clear.  NECK:  Supple, no jugular venous distention. No thyroid enlargement, no tenderness.  LUNGS: Normal breath sounds bilaterally, no wheezing, rales, rhonchi. No use of accessory muscles of respiration.  CARDIOVASCULAR: S1, S2 normal. No murmurs, rubs, or gallops.  ABDOMEN: Soft, nontender, nondistended. Bowel sounds present. No organomegaly or mass.  EXTREMITIES: No cyanosis, clubbing or edema b/l.    NEUROLOGIC: Cranial nerves II through XII are intact. No focal Motor or sensory deficits b/l.  PSYCHIATRIC: The patient is alert and oriented x 3.  SKIN: No obvious rash, lesion, or ulcer.   DATA REVIEW:   CBC Recent Labs  Lab 09/08/18 0313  WBC 16.4*  HGB 12.9  HCT 41.3  PLT 333     Chemistries  Recent Labs  Lab 09/08/18 0313  NA 142  K 4.0  CL 103  CO2 31  GLUCOSE 131*  BUN 22  CREATININE 0.71  CALCIUM 8.5*    Cardiac Enzymes No results for input(s): TROPONINI in the last 168 hours.  Microbiology Results  Results for orders placed or performed during the hospital encounter of 09/05/18  Culture, blood (Routine x 2)     Status: None (Preliminary result)   Collection Time: 09/05/18  1:22 PM  Result Value Ref Range Status   Specimen Description BLOOD LEFT ANTECUBITAL  Final   Special Requests   Final    BOTTLES DRAWN AEROBIC AND ANAEROBIC Blood Culture results may not be optimal due to an excessive volume of blood received in culture bottles   Culture   Final    NO GROWTH 3 DAYS Performed at [facility], [address] [place], [place] [address]    Report Status PENDING  Incomplete    RADIOLOGY:  No results found.    Management plans discussed with the patient, family and they are in agreement.  CODE STATUS:     Code Status Orders  (From admission, onward)         Start     Ordered   09/05/18 1447  Full code  Continuous     09/05/18 1448          TOTAL TIME TAKING CARE OF THIS PATIENT: 40 minutes.    Houston Siren M.D on 09/08/2018 at 11:53 AM  Between 7am to 6pm - Pager - [phone]  After 6pm go to [email or web address] - Scientist, research (life sciences) [facility]  Office  [phone]  CC: Primary care physician; Lauro Regulus, MD

## 2018-09-08 NOTE — Progress Notes (Signed)
Per MD patient can discharge back to [facility] and does not require SNF. Clinical Social Worker (CSW) met with patient and made her aware of above. Patient prefers to go back to [facility]. Patient is medically stable for D/C today. Per patient's daughter Andrey Campanile she will transport patient back to [facility] today and has portable oxygen tank for patient. RN will call report. Per Tanzania Runner, broadcasting/film/video at [employer] patient can return today. Per Melissa RN at [facility] patient is on chronic 2 liters of oxygen at ALF. CSW sent D/C summary and FL2 to [facility] [address] RN case manager arranged home health through [facility] as requested by [facility]. Please reconsult if future social work needs arise. CSW signing off.   [employer], LCSW [phone]

## 2018-09-10 LAB — CULTURE, BLOOD (ROUTINE X 2): Culture: NO GROWTH

## 2018-09-12 ENCOUNTER — Telehealth: Payer: Self-pay

## 2018-09-12 NOTE — Telephone Encounter (Signed)
Flagged on EMMI report for not reading discharge papers and not knowing who to call about changes in condition.  Called and spoke with patient's daughter, Andrey Campanile.  She reports they are aware to call Dr. Ewell Poe office for any changes and concerns they may have.  Reports mother is doing well at [facility] and has therapists coming to work with her.  No other questions or concerns at this time.  I thanked her for her time and informed her the patient would receive one more automated call checking in over the next few days.

## 2019-04-12 IMAGING — MR MR LUMBAR SPINE W/O CM
5 series · 42 of 48 positions shown · non-contrast
Comparison: None.

CLINICAL DATA: Sharp pain for 2 weeks.

EXAM:
MRI LUMBAR SPINE WITHOUT CONTRAST
TECHNIQUE: Multiplanar, multisequence MR imaging of the lumbar spine was
performed. No intravenous contrast was administered.

[Series 2: T2 · sagittal · 4.0mm · 1.02mm/px · 6 of 17 slices shown (1 of 2)]
[im 1/17]
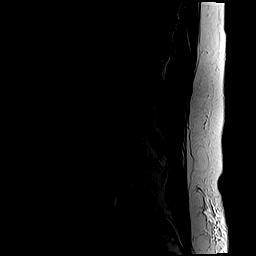
[im 4/17]
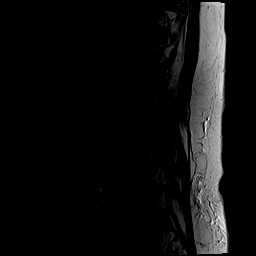
[im 7/17]
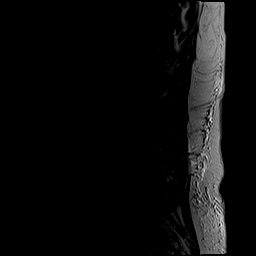
[im 10/17]
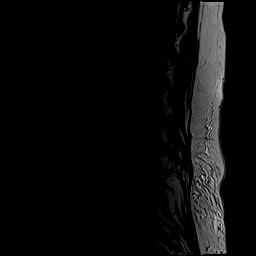
[im 13/17]
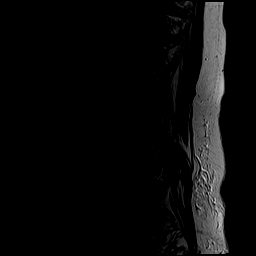
[im 17/17]
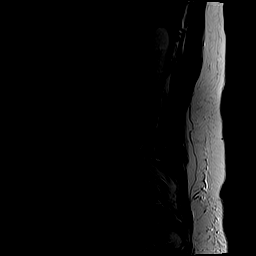

[Series 3: T1 · sagittal · 4.0mm · 1.02mm/px · 7 of 17 slices shown (1 of 2)]
[im 1/17]
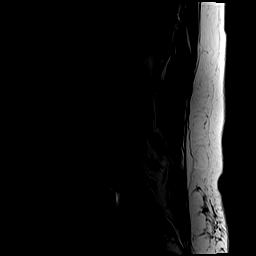
[im 3/17]
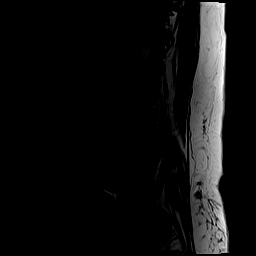
[im 6/17]
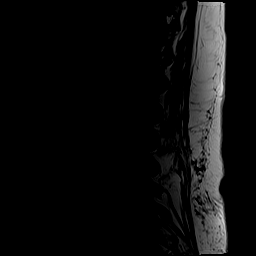
[im 9/17]
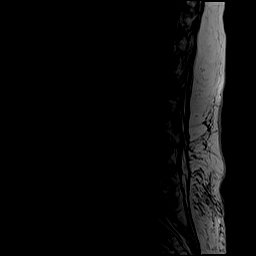
[im 11/17]
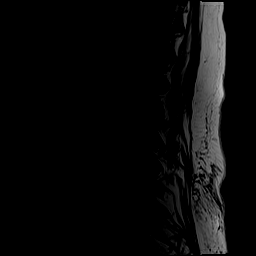
[im 14/17]
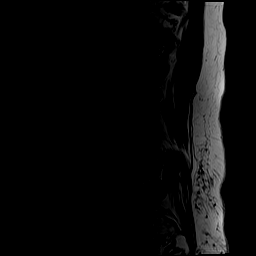
[im 17/17]
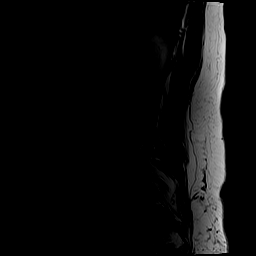

[Series 4: STIR · sagittal · 4.0mm · 1.02mm/px · 7 of 17 slices shown]
[im 1/17]
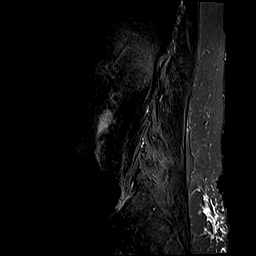
[im 3/17]
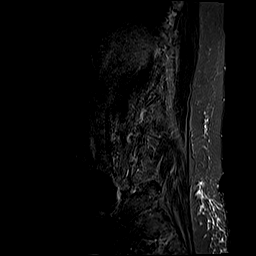
[im 6/17]
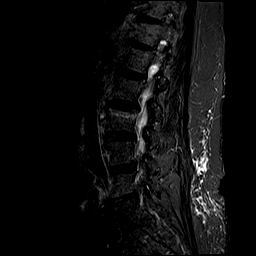
[im 9/17]
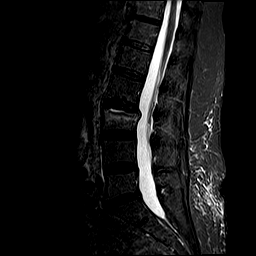
[im 11/17]
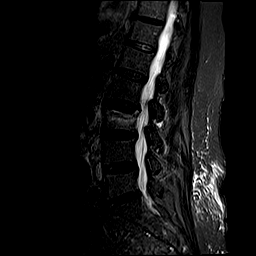
[im 14/17]
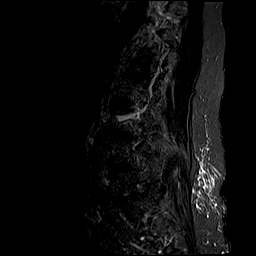
[im 17/17]
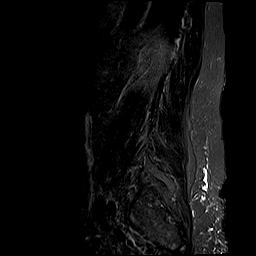

[Series 5: T2 · axial · 4.0mm · 0.78mm/px · z∈[-117,+93]mm · 14 of 36 slices shown (2 of 2)]
[im 1/36]
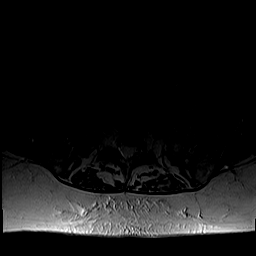
[im 3/36]
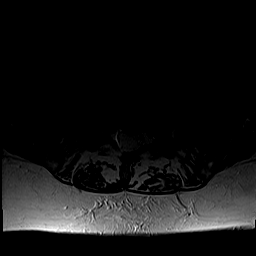
[im 6/36]
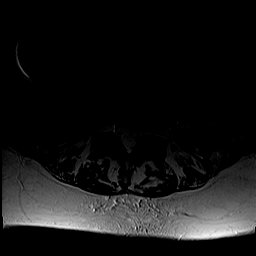
[im 9/36]
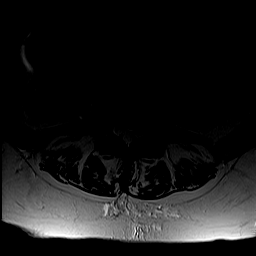
[im 11/36]
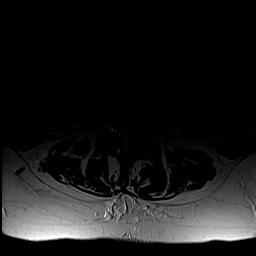
[im 14/36]
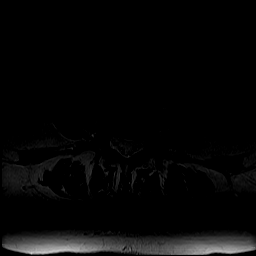
[im 17/36]
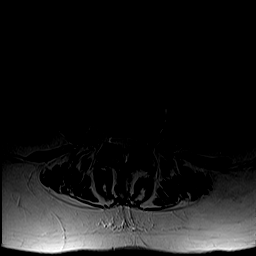
[im 19/36]
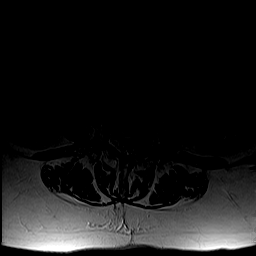
[im 22/36]
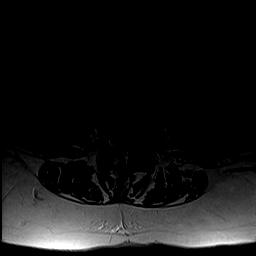
[im 25/36]
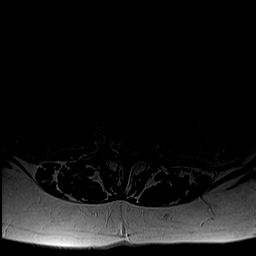
[im 27/36]
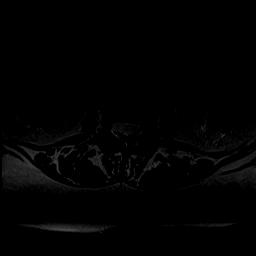
[im 30/36]
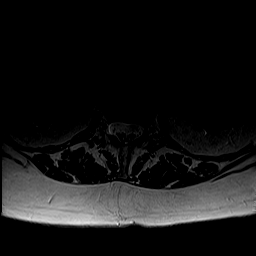
[im 33/36]
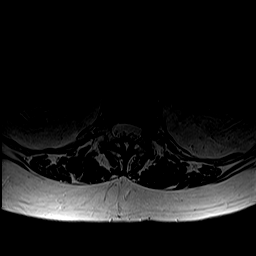
[im 36/36]
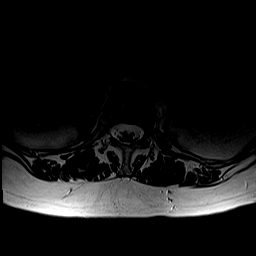

[Series 6: T1 · axial · 4.0mm · 0.39mm/px · z∈[-117,+93]mm · 8 of 36 slices shown (2 of 2)]
[im 1/36]
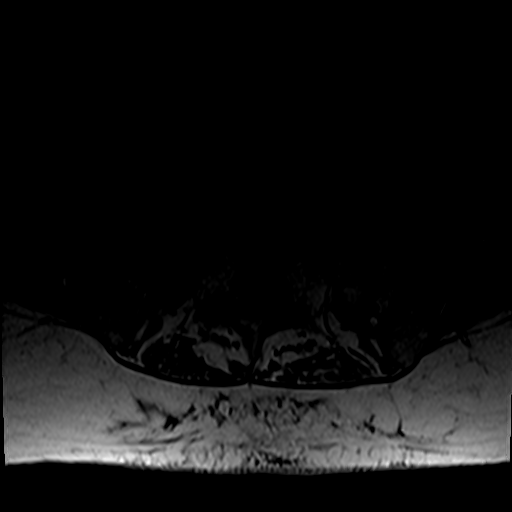
[im 6/36]
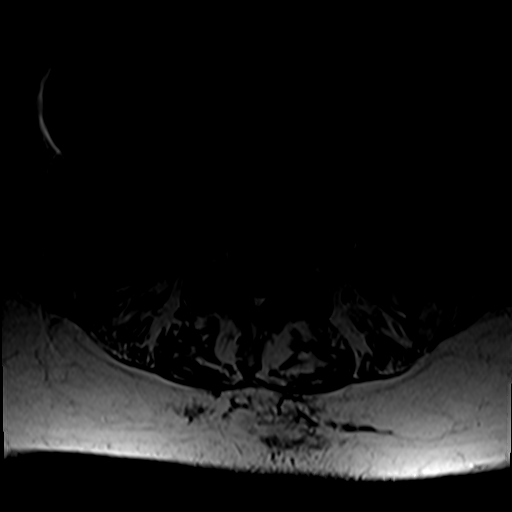
[im 11/36]
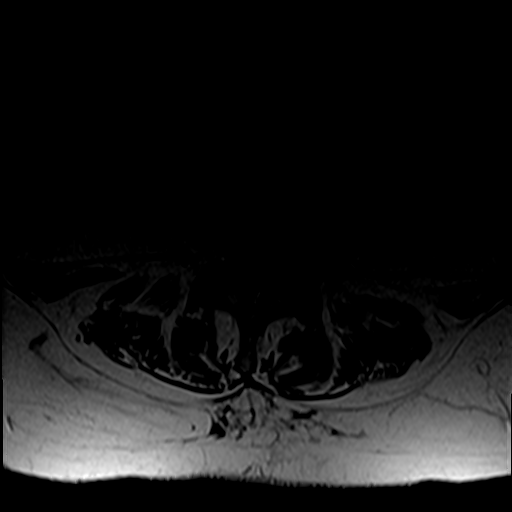
[im 17/36]
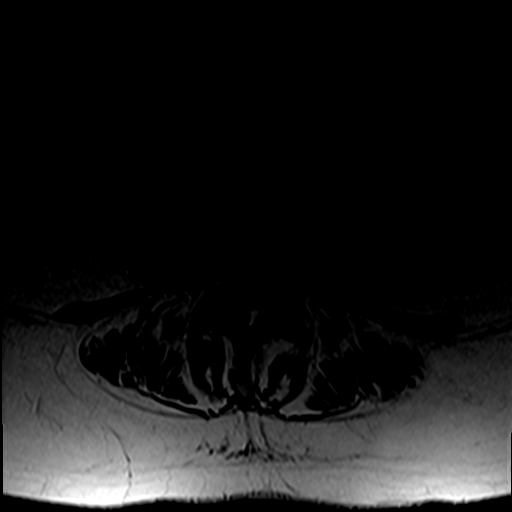
[im 19/36]
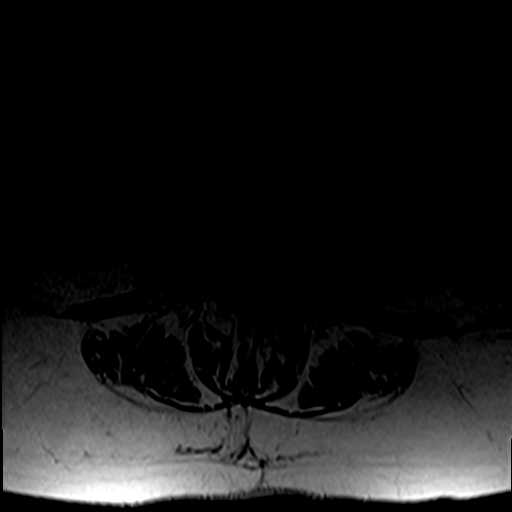
[im 25/36]
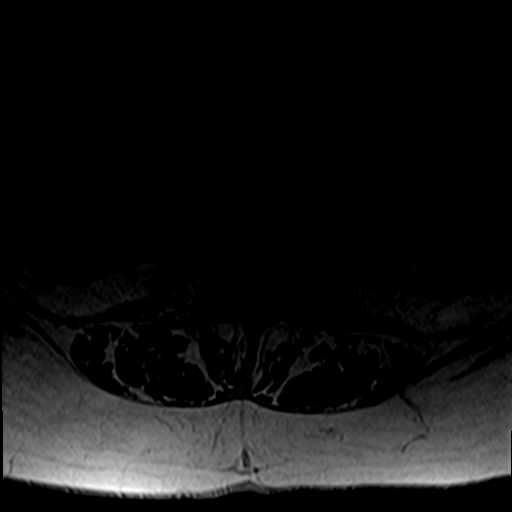
[im 30/36]
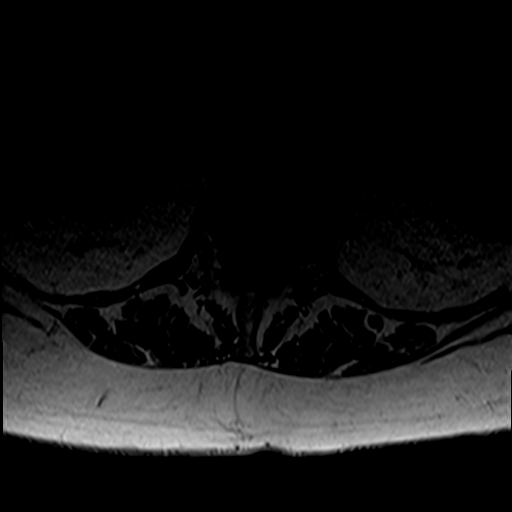
[im 36/36]
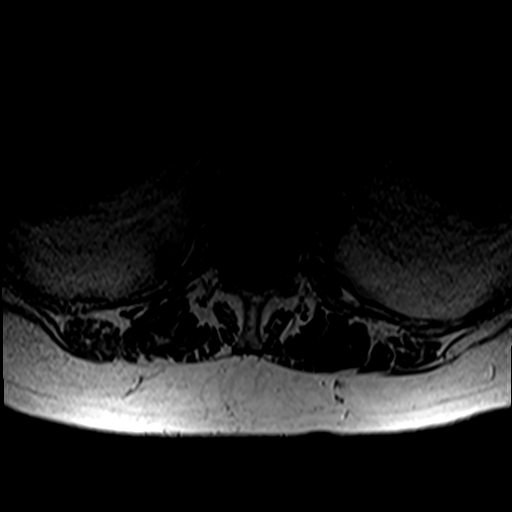

[42 of 48 positions shown; findings below may reference images not displayed]

FINDINGS: Segmentation:  Standard.

Alignment:  Physiologic.

Vertebrae: Acute-subacute L3 vertebral body compression fracture
with associated marrow edema along the superior half of the
vertebral body and approximately 15% height loss with 5 mm of
retropulsion of the superior posterior margin of the L3 vertebral
body mildly impressing on the thecal sac without central canal
stenosis. Remainder the vertebral body heights are maintained. No
discitis or osteomyelitis.

Conus medullaris and cauda equina: Conus extends to the T12 level.
Conus and cauda equina appear normal.

Paraspinal and other soft tissues: No acute paraspinal abnormality.

Disc levels:

Disc spaces: Degenerative disc disease with disc height loss at
L5-S1.

T12-L1: No significant disc bulge. No evidence of neural foraminal
stenosis. No central canal stenosis.

L1-L2: No significant disc bulge. No evidence of neural foraminal
stenosis. No central canal stenosis.

L2-L3: No significant disc bulge. No evidence of neural foraminal
stenosis. No central canal stenosis. Mild bilateral facet
arthropathy.

L3-L4: Mild broad-based disc bulge. Mild bilateral facet
arthropathy. No evidence of neural foraminal stenosis. No central
canal stenosis.

L4-L5: Mild broad-based disc bulge. Mild bilateral facet
arthropathy. No evidence of neural foraminal stenosis. No central
canal stenosis.

L5-S1: Mild broad-based disc bulge. No evidence of neural foraminal
stenosis. No central canal stenosis.
IMPRESSION: 1. Acute-subacute L3 vertebral body compression fracture with
approximately 15% height loss and marrow edema throughout the
superior half of the vertebral body. 5 mm of retropulsion of the
superior posterior margin impressing on the thecal sac without
central canal stenosis.

## 2024-09-10 DEATH — deceased
